# Patient Record
Sex: Female | Born: 1995 | Race: Black or African American | Hispanic: No | Marital: Single | Smoking: Never smoker
Health system: Southern US, Community
[De-identification: ages and names within clinical notes are randomized; demographics above are authoritative.]

## PROBLEM LIST (undated history)

## (undated) ENCOUNTER — Inpatient Hospital Stay (HOSPITAL_COMMUNITY): Payer: Self-pay

## (undated) DIAGNOSIS — O139 Gestational [pregnancy-induced] hypertension without significant proteinuria, unspecified trimester: Secondary | ICD-10-CM

## (undated) DIAGNOSIS — O24419 Gestational diabetes mellitus in pregnancy, unspecified control: Secondary | ICD-10-CM

## (undated) DIAGNOSIS — B977 Papillomavirus as the cause of diseases classified elsewhere: Secondary | ICD-10-CM

## (undated) DIAGNOSIS — F419 Anxiety disorder, unspecified: Secondary | ICD-10-CM

## (undated) DIAGNOSIS — N390 Urinary tract infection, site not specified: Secondary | ICD-10-CM

## (undated) DIAGNOSIS — Z789 Other specified health status: Secondary | ICD-10-CM

## (undated) HISTORY — PX: TOOTH EXTRACTION: SUR596

## (undated) HISTORY — DX: Gestational diabetes mellitus in pregnancy, unspecified control: O24.419

## (undated) HISTORY — PX: OTHER SURGICAL HISTORY: SHX169

## (undated) HISTORY — DX: Papillomavirus as the cause of diseases classified elsewhere: B97.7

---

## 2021-07-20 ENCOUNTER — Other Ambulatory Visit: Payer: Self-pay

## 2021-07-20 ENCOUNTER — Emergency Department (HOSPITAL_COMMUNITY)
Admission: EM | Admit: 2021-07-20 | Discharge: 2021-07-20 | Disposition: A | Discharge status: Home / Self Care | Payer: Self-pay | Attending: Emergency Medicine | Admitting: Emergency Medicine

## 2021-07-20 DIAGNOSIS — T7840XA Allergy, unspecified, initial encounter: Secondary | ICD-10-CM | POA: Insufficient documentation

## 2021-07-20 MED ORDER — PREDNISONE 20 MG PO TABS
60.0000 mg | ORAL_TABLET | Freq: Once | ORAL | Status: AC
  Administered 2021-07-20: 04:00:00 60 mg via ORAL
  Filled 2021-07-20: qty 3

## 2021-07-20 MED ORDER — HYDROXYZINE HCL 25 MG PO TABS: 25.0000 mg | ORAL_TABLET | Freq: Four times a day (QID) | ORAL | 0 refills | Status: DC

## 2021-07-20 MED ORDER — HYDROCORTISONE 1 % EX CREA: TOPICAL_CREAM | CUTANEOUS | 0 refills | Status: DC

## 2021-07-20 MED ORDER — PREDNISONE 50 MG PO TABS: 50.0000 mg | ORAL_TABLET | Freq: Every day | ORAL | 0 refills | Status: AC

## 2021-07-20 NOTE — ED Provider Notes (Signed)
[facility] EMERGENCY DEPARTMENT Provider Note   CSN: [id number] Arrival date & time: 07/20/21  8413     History Rash  Alexis Erickson is a 25 y.o. female with history significant for asthma who presents for evaluation of rash.  Noticed a few hours ago she was itching on her back and posterior aspect of her arms.  Looked and developed "bumps."  Did recently change detergents.  No sensation of throat closing, abdominal pain, emesis, paresthesias or weakness.  Took Benadryl PTA which is help with itching.  No prior history of anaphylaxis or allergic reaction.  No new food intakes, perfumes denies additional rating or relieving factors.  No insect/tick bites.  Does not take medications daily at baseline.  History obtained from patient and past medical records.  No interpreter used.  HPI     No past medical history on file.  There are no problems to display for this patient.  History reviewed  OB History   No obstetric history on file.     No family history on file.     Home Medications Prior to Admission medications   Medication Sig Start Date End Date Taking? Authorizing Provider  hydrocortisone cream 1 % Apply to affected area 2 times daily 07/20/21  Yes Tramaine Sauls A, PA-C  hydrOXYzine (ATARAX) 25 MG tablet Take 1 tablet (25 mg total) by mouth every 6 (six) hours. 07/20/21  Yes Ethelmae Ringel A, PA-C  predniSONE (DELTASONE) 50 MG tablet Take 1 tablet (50 mg total) by mouth daily for 5 days. 07/20/21 07/25/21 Yes Ronnisha Felber A, PA-C    Allergies    Patient has no known allergies.  Review of Systems   Review of Systems  Constitutional: Negative.   HENT: Negative.    Respiratory: Negative.    Cardiovascular: Negative.   Gastrointestinal: Negative.   Genitourinary: Negative.   Musculoskeletal: Negative.   Skin: Negative.   Neurological: Negative.   All other systems reviewed and are negative.  Physical Exam Updated Vital  Signs BP 120/80 (BP Location: Left Arm)    Pulse (!) 54    Temp 97.9 F (36.6 C) (Oral)    Resp 20    Ht 5' (1.524 m)    Wt 52.6 kg    SpO2 99%    BMI 22.65 kg/m   Physical Exam Vitals and nursing note reviewed.  Constitutional:      General: She is not in acute distress.    Appearance: She is well-developed. She is not ill-appearing, toxic-appearing or diaphoretic.  HENT:     Head: Normocephalic and atraumatic.     Nose: Nose normal.     Mouth/Throat:     Mouth: Mucous membranes are moist.     Comments: Posterior oropharynx clear.  Sublingual area soft.  No angioedema. Eyes:     Pupils: Pupils are equal, round, and reactive to light.  Neck:     Comments: Full range of motion Cardiovascular:     Rate and Rhythm: Normal rate.     Pulses: Normal pulses.     Heart sounds: Normal heart sounds.  Pulmonary:     Effort: Pulmonary effort is normal. No respiratory distress.     Breath sounds: Normal breath sounds.     Comments: Clear bilaterally, speaks in full sentences without difficulty Abdominal:     General: Bowel sounds are normal. There is no distension.     Palpations: Abdomen is soft.  Musculoskeletal:  General: Normal range of motion.     Cervical back: Normal range of motion and neck supple.     Comments: Full range of motion, no bony tenderness  Skin:    General: Skin is warm and dry.     Capillary Refill: Capillary refill takes less than 2 seconds.     Findings: Rash present.     Comments: Urticaria back as well as posterior arms.  No vesicles, bulla, desquamated skin, fluctuance, induration, target lesions.  Neurological:     General: No focal deficit present.     Mental Status: She is alert and oriented to person, place, and time.  Psychiatric:        Mood and Affect: Mood normal.    ED Results / Procedures / Treatments   Labs (all labs ordered are listed, but only abnormal results are displayed) Labs Reviewed - No data to  display  EKG None  Radiology No results found.  Procedures Procedures   Medications Ordered in ED Medications  predniSONE (DELTASONE) tablet 60 mg (has no administration in time range)    ED Course  I have reviewed the triage vital signs and the nursing notes.  Pertinent labs & imaging results that were available during my care of the patient were reviewed by me and considered in my medical decision making (see chart for details).  Rash consistent with allergic reaction. Patient denies any difficulty breathing or swallowing.  Pt has a patent airway without stridor and is handling secretions without difficulty; no angioedema. No blisters, no pustules, no warmth, no draining sinus tracts, no superficial abscesses, no bullous impetigo, no vesicles, no desquamation, no target lesions with dusky purpura or a central bulla. Not tender to touch. No concern for superimposed infection. No concern for SJS, TEN, TSS, tick borne illness, syphilis or other life-threatening condition. Will discharge home with short course of steroids and recommend hydroxyzine as needed for pruritis.  No anaphylaxis on exam.  The patient has been appropriately medically screened and/or stabilized in the ED. I have low suspicion for any other emergent medical condition which would require further screening, evaluation or treatment in the ED or require inpatient management.  Patient is hemodynamically stable and in no acute distress.  Patient able to ambulate in department prior to ED.  Evaluation does not show acute pathology that would require ongoing or additional emergent interventions while in the emergency department or further inpatient treatment.  I have discussed the diagnosis with the patient and answered all questions.  Pain is been managed while in the emergency department and patient has no further complaints prior to discharge.  Patient is comfortable with plan discussed in room and is stable for discharge at  this time.  I have discussed strict return precautions for returning to the emergency department.  Patient was encouraged to follow-up with PCP/specialist refer to at discharge.     MDM Rules/Calculators/A&P                            Final Clinical Impression(s) / ED Diagnoses Final diagnoses:  Allergic reaction, initial encounter    Rx / DC Orders ED Discharge Orders          Ordered    predniSONE (DELTASONE) 50 MG tablet  Daily        07/20/21 0405    hydrocortisone cream 1 %        07/20/21 0405    hydrOXYzine (ATARAX) 25 MG tablet  Every 6 hours        07/20/21 0405             Navah Grondin A, PA-C 07/20/21 0405    Geoffery Lyons, MD 07/20/21 [phone]

## 2021-07-20 NOTE — Discharge Instructions (Addendum)
Use the medications as prescribed.  Do not place hydrocortisone cream on your face.  Take additional Benadryl taking hydroxyzine.  Return for new worsening symptoms such as shortness of breath, vomiting, worsening rash

## 2021-07-20 NOTE — ED Notes (Signed)
Patient given discharge instructions. Questions were answered. Patient verbalized understanding of discharge instructions and care at home.  

## 2021-07-20 NOTE — ED Triage Notes (Signed)
Patient arrives with complaints of a sudden onset of itching and rash x4 hours. Patient states that it started suddenly and she took benadryl for the symptoms.   Patient does have mild redness to skin. Patient does endorse that she started using a new detergent and soap recently.

## 2022-01-06 ENCOUNTER — Encounter (HOSPITAL_COMMUNITY): Payer: Self-pay

## 2022-01-06 ENCOUNTER — Inpatient Hospital Stay (HOSPITAL_COMMUNITY)
Admission: AD | Admit: 2022-01-06 | Discharge: 2022-01-06 | Disposition: A | Discharge status: Home / Self Care | Payer: Medicaid Other | Attending: Obstetrics & Gynecology | Admitting: Obstetrics & Gynecology

## 2022-01-06 ENCOUNTER — Other Ambulatory Visit: Payer: Self-pay

## 2022-01-06 ENCOUNTER — Inpatient Hospital Stay (HOSPITAL_COMMUNITY): Payer: Medicaid Other

## 2022-01-06 DIAGNOSIS — R42 Dizziness and giddiness: Secondary | ICD-10-CM | POA: Diagnosis not present

## 2022-01-06 DIAGNOSIS — R1030 Lower abdominal pain, unspecified: Secondary | ICD-10-CM | POA: Insufficient documentation

## 2022-01-06 DIAGNOSIS — Z6711 Type A blood, Rh negative: Secondary | ICD-10-CM | POA: Insufficient documentation

## 2022-01-06 DIAGNOSIS — N898 Other specified noninflammatory disorders of vagina: Secondary | ICD-10-CM | POA: Insufficient documentation

## 2022-01-06 DIAGNOSIS — O3680X0 Pregnancy with inconclusive fetal viability, not applicable or unspecified: Secondary | ICD-10-CM | POA: Insufficient documentation

## 2022-01-06 DIAGNOSIS — Z3A01 Less than 8 weeks gestation of pregnancy: Secondary | ICD-10-CM | POA: Diagnosis not present

## 2022-01-06 DIAGNOSIS — O26891 Other specified pregnancy related conditions, first trimester: Secondary | ICD-10-CM | POA: Diagnosis not present

## 2022-01-06 LAB — CBC
HCT: 40.8 % (ref 36.0–46.0)
Hemoglobin: 13.7 g/dL (ref 12.0–15.0)
MCH: 28 pg (ref 26.0–34.0)
MCHC: 33.6 g/dL (ref 30.0–36.0)
MCV: 83.4 fL (ref 80.0–100.0)
Platelets: 182 10*3/uL (ref 150–400)
RBC: 4.89 MIL/uL (ref 3.87–5.11)
RDW: 13.2 % (ref 11.5–15.5)
WBC: 10.1 10*3/uL (ref 4.0–10.5)
nRBC: 0 % (ref 0.0–0.2)

## 2022-01-06 LAB — URINALYSIS, ROUTINE W REFLEX MICROSCOPIC
Bilirubin Urine: NEGATIVE
Glucose, UA: NEGATIVE mg/dL
Hgb urine dipstick: NEGATIVE
Ketones, ur: 20 mg/dL — AB
Nitrite: NEGATIVE
Protein, ur: NEGATIVE mg/dL
Specific Gravity, Urine: 1.01 (ref 1.005–1.030)
pH: 7 (ref 5.0–8.0)

## 2022-01-06 LAB — POCT PREGNANCY, URINE: Preg Test, Ur: POSITIVE — AB

## 2022-01-06 LAB — WET PREP, GENITAL
Clue Cells Wet Prep HPF POC: NONE SEEN
Sperm: NONE SEEN
Trich, Wet Prep: NONE SEEN
WBC, Wet Prep HPF POC: >=10 — AB (ref <10)
Yeast Wet Prep HPF POC: NONE SEEN

## 2022-01-06 LAB — ABO/RH
ABO/RH(D): A NEG
Antibody Screen: NEGATIVE

## 2022-01-06 LAB — HCG, QUANTITATIVE, PREGNANCY: hCG, Beta Chain, Quant, S: 136931 m[IU]/mL — ABNORMAL HIGH (ref <5)

## 2022-01-06 NOTE — MAU Note (Addendum)
...  Alexis Erickson is a 26 y.o. at [redacted]w[redacted]d here in MAU reporting: Intermittent left lower abdominal "discomfort" that feels as if she needs to have a bowel movement but reports she had a bowel movement this morning that was green and loose. She is also endorsing occasional dizziness with standing and different activities. She reports she drinks around four normal sized water bottles each day. Denies vaginal bleeding and vaginal itching. Endorses light yellow vaginal discharge. Last IC this past Wednesday.  LMP: 11/11/2021 Onset of complaint: 01/05/2022 Pain score: 4/10 left lower abdomen  Lab orders placed from triage: POCT Pregnancy, UA

## 2022-01-06 NOTE — MAU Provider Note (Signed)
History     CSN: [id number]  Arrival date and time: 01/06/22 1034   Event Date/Time   First Provider Initiated Contact with Patient 01/06/22 1156      Chief Complaint  Patient presents with   Stomach pn dizziness    Stomach pn dizziness   Abdominal Pain   HPI [name] is a 26 y.o. G1P0 at [redacted]w[redacted]d who presents to MAU today with complaint of lower abdominal pain. She denies vaginal bleeding, but has noted some light yellow discharge recently. Last intercourse was last week. She rates her pain at 4/10 now. She denies N/V/D or UTI symptoms. LMP4/04/2022.    OB History     Gravida  1   Para      Term      Preterm      AB      Living         SAB      IAB      Ectopic      Multiple      Live Births              History reviewed. No pertinent past medical history.  Past Surgical History:  Procedure Laterality Date   OTHER SURGICAL HISTORY     teeth removed    Family History  Problem Relation Age of Onset   Diabetes Mother    Cancer Maternal Grandmother        breast    Social History   Tobacco Use   Smoking status: Never   Smokeless tobacco: Never  Substance Use Topics   Alcohol use: Never   Drug use: Never    Allergies: No Known Allergies  No medications prior to admission.    Review of Systems  Constitutional:  Negative for fatigue.  Gastrointestinal:  Negative for abdominal pain, nausea and vomiting.  Genitourinary:  Positive for vaginal discharge. Negative for dysuria, flank pain and vaginal bleeding.  Physical Exam   Blood pressure 115/70, pulse 71, temperature 98.4 F (36.9 C), temperature source Oral, resp. rate 17, height 5\' 1"  (1.549 m), weight 51.8 kg, last menstrual period 11/13/2021, SpO2 100 %.  Physical Exam Vitals and nursing note reviewed.  Constitutional:      General: She is not in acute distress.    Appearance: She is well-developed and normal weight.  HENT:     Head: Normocephalic and  atraumatic.  Abdominal:     General: Abdomen is flat.     Tenderness: There is no abdominal tenderness.  Skin:    General: Skin is warm and dry.     Findings: No erythema.  Neurological:     Mental Status: She is alert.  Psychiatric:        Mood and Affect: Mood normal.   Results for orders placed or performed during the hospital encounter of 01/06/22 (from the past 24 hour(s))  Pregnancy, urine POC     Status: Abnormal   Collection Time: 01/06/22 11:03 AM  Result Value Ref Range   Preg Test, Ur POSITIVE (A) NEGATIVE  Urinalysis, Routine w reflex microscopic Urine, Clean Catch     Status: Abnormal   Collection Time: 01/06/22 11:12 AM  Result Value Ref Range   Color, Urine YELLOW YELLOW   APPearance HAZY (A) CLEAR   Specific Gravity, Urine 1.010 1.005 - 1.030   pH 7.0 5.0 - 8.0   Glucose, UA NEGATIVE NEGATIVE mg/dL   Hgb urine dipstick NEGATIVE NEGATIVE   Bilirubin Urine NEGATIVE  NEGATIVE   Ketones, ur 20 (A) NEGATIVE mg/dL   Protein, ur NEGATIVE NEGATIVE mg/dL   Nitrite NEGATIVE NEGATIVE   Leukocytes,Ua LARGE (A) NEGATIVE   RBC / HPF 0-5 0 - 5 RBC/hpf   WBC, UA 6-10 0 - 5 WBC/hpf   Bacteria, UA MANY (A) NONE SEEN   Squamous Epithelial / LPF 6-10 0 - 5   Mucus PRESENT   CBC     Status: None   Collection Time: 01/06/22 11:54 AM  Result Value Ref Range   WBC 10.1 4.0 - 10.5 K/uL   RBC 4.89 3.87 - 5.11 MIL/uL   Hemoglobin 13.7 12.0 - 15.0 g/dL   HCT 45.440.8 09.836.0 - 11.946.0 %   MCV 83.4 80.0 - 100.0 fL   MCH 28.0 26.0 - 34.0 pg   MCHC 33.6 30.0 - 36.0 g/dL   RDW 14.713.2 82.911.5 - 56.215.5 %   Platelets 182 150 - 400 K/uL   nRBC 0.0 0.0 - 0.2 %  hCG, quantitative, pregnancy     Status: Abnormal   Collection Time: 01/06/22 11:54 AM  Result Value Ref Range   hCG, Beta Chain, Quant, S 136,931 (H) <5 mIU/mL  ABO/Rh     Status: None   Collection Time: 01/06/22 11:54 AM  Result Value Ref Range   ABO/RH(D) A NEG    Antibody Screen      NEG Performed at [facility], [address] [place], [place] [address]   Wet prep, genital     Status: Abnormal   Collection Time: 01/06/22 12:06 PM   Specimen: Vaginal  Result Value Ref Range   Yeast Wet Prep HPF POC NONE SEEN NONE SEEN   Trich, Wet Prep NONE SEEN NONE SEEN   Clue Cells Wet Prep HPF POC NONE SEEN NONE SEEN   WBC, Wet Prep HPF POC >=10 (A) <10   Sperm NONE SEEN    US OB Transvaginal  Result Date: 01/06/2022 CLINICAL DATA:  Pregnant of unknown anatomic location, abdominal pain, LMP 11/13/2021, pending quantitative beta HCG EXAM: TRANSVAGINAL OB ULTRASOUND TECHNIQUE: Transvaginal ultrasound was performed for complete evaluation of the gestation as well as the maternal uterus, adnexal regions, and pelvic cul-de-sac. COMPARISON:  None FINDINGS: Intrauterine gestational sac: Present, single Yolk sac:  Present Embryo:  Present Cardiac Activity: Present Heart Rate: Not identified bpm MSD:  27.7 mm, prominent size for fetal pole and yolk sac CRL:   2.5 mm   5 w 5 d                  US EDC: 09/03/2022 Subchorionic hemorrhage:  None visualized. Maternal uterus/adnexae: Uterus anteverted, otherwise unremarkable. RIGHT ovary measures 4.3 x 2.7 x 4.5 cm and contains a complicated cyst question hemorrhagic corpus luteal cyst 3.1 cm greatest size. Is seen LEFT ovary normal size and morphology, 3.6 x 2.7 x 2.5 cm. No free pelvic fluid or adnexal masses. IMPRESSION: Intrauterine gestational sac, containing a fetal pole and yolk sac. Gestational sac appears large for the size of the fetal pole and yolk sac. No fetal cardiac activity identified. Findings are suspicious but not yet definitive for failed pregnancy. Recommend follow-up US in 10-14 days for definitive diagnosis. This recommendation follows SRU consensus guidelines: Diagnostic Criteria for Nonviable Pregnancy Early in the First Trimester. Malva Limes Engl J Med 2013; 578:4696-29; 369:1443-51. Electronically Signed   By: Ulyses SouthwardMark  Boles M.D.   On: 01/06/2022 13:02    MAU Course   Procedures None  MDM +UPT UA, wet prep,  GC/chlamydia, CBC, ABO/Rh, quant hCG and [place] today to rule out ectopic pregnancy Urine culture ordered   Assessment and Plan  A:  SIUP at [redacted]w[redacted]d Pregnancy of uncertain viability  Abdominal pain in pregnancy, first trimester   P: Discharge home Urine culture and GC/CT pending  First trimester precautions discussed Patient advised to follow-up with [facility] on 01/17/22 for follow-up US Patient may return to MAU as needed or if her condition were to change or worsen  Vonzella Nipple, PA-C 01/06/2022, 2:05 PM

## 2022-01-09 ENCOUNTER — Other Ambulatory Visit: Payer: Self-pay | Admitting: Advanced Practice Midwife

## 2022-01-09 DIAGNOSIS — A749 Chlamydial infection, unspecified: Secondary | ICD-10-CM

## 2022-01-09 HISTORY — DX: Chlamydial infection, unspecified: A74.9

## 2022-01-09 LAB — CULTURE, OB URINE: Culture: >=100000 — AB

## 2022-01-09 LAB — GC/CHLAMYDIA PROBE AMP (~~LOC~~) NOT AT ARMC
Chlamydia: POSITIVE — AB
Comment: NEGATIVE
Comment: NORMAL
Neisseria Gonorrhea: NEGATIVE

## 2022-01-09 MED ORDER — AZITHROMYCIN 500 MG PO TABS: 1000.0000 mg | ORAL_TABLET | Freq: Once | ORAL | 0 refills | Status: AC

## 2022-01-10 ENCOUNTER — Encounter: Payer: Self-pay | Admitting: Advanced Practice Midwife

## 2022-01-10 ENCOUNTER — Other Ambulatory Visit: Payer: Self-pay | Admitting: Advanced Practice Midwife

## 2022-01-10 MED ORDER — NITROFURANTOIN MONOHYD MACRO 100 MG PO CAPS: 100.0000 mg | ORAL_CAPSULE | Freq: Two times a day (BID) | ORAL | 0 refills | Status: AC

## 2022-01-17 ENCOUNTER — Encounter (HOSPITAL_COMMUNITY): Payer: Self-pay | Admitting: Obstetrics & Gynecology

## 2022-01-17 ENCOUNTER — Ambulatory Visit (INDEPENDENT_AMBULATORY_CARE_PROVIDER_SITE_OTHER): Payer: Medicaid Other

## 2022-01-17 ENCOUNTER — Other Ambulatory Visit: Payer: Self-pay

## 2022-01-17 ENCOUNTER — Telehealth: Payer: Self-pay

## 2022-01-17 DIAGNOSIS — O3680X0 Pregnancy with inconclusive fetal viability, not applicable or unspecified: Secondary | ICD-10-CM

## 2022-01-17 DIAGNOSIS — Z3A01 Less than 8 weeks gestation of pregnancy: Secondary | ICD-10-CM

## 2022-01-17 DIAGNOSIS — O021 Missed abortion: Secondary | ICD-10-CM | POA: Diagnosis not present

## 2022-01-17 NOTE — Progress Notes (Signed)
   GYNECOLOGY OFFICE VISIT NOTE  History:   Kimbery Dineen Trojanowski is a 26 y.o. G1P0 here today for ultrasound results.  Ultrasound shows irregularly shaped gestational sac, no growth in fetal pole from previous ultrasound and no FHR.  Abdominal pain: No   Vaginal bleeding: No    OB History  Gravida Para Term Preterm AB Living  1            SAB IAB Ectopic Multiple Live Births               # Outcome Date GA Lbr Len/2nd Weight Sex Delivery Anes PTL Lv  1 Current              Health Maintenance Due  Topic Date Due   HPV VACCINES (1 - 2-dose series) Never done   HIV Screening  Never done   Hepatitis C Screening  Never done   TETANUS/TDAP  Never done   PAP-Cervical Cytology Screening  Never done   PAP SMEAR-Modifier  Never done   COVID-19 Vaccine (3 - Pfizer series) 05/30/2020    No past medical history on file.  Past Surgical History:  Procedure Laterality Date   OTHER SURGICAL HISTORY     teeth removed    The following portions of the patient's history were reviewed and updated as appropriate: allergies, current medications, past family history, past medical history, past social history, past surgical history and problem list.   Review of Systems:  Pertinent items noted in HPI and remainder of comprehensive ROS otherwise negative.  Physical Exam:  LMP 11/13/2021 (Exact Date)  -unable to obtain VS due to patient distress regarding results  CONSTITUTIONAL: Well-developed, well-nourished female in no acute distress.  HEENT:  Normocephalic, atraumatic. External right and left ear normal. No scleral icterus.  NECK: Normal range of motion, supple, no masses noted on observation SKIN: No rash noted. Not diaphoretic. No erythema. No pallor. MUSCULOSKELETAL: Normal range of motion. No edema noted. NEUROLOGIC: Alert and oriented to person, place, and time. Normal muscle tone coordination.  PSYCHIATRIC: Normal mood and affect. Normal behavior. Normal judgment and thought  content. RESPIRATORY: Effort normal, no problems with respiration noted   Assessment and Plan:  Nonviable Pregnancy - O02.89  CNM informed patient of results of ultrasound showing missed AB. Condolences provided and space given for patient to process emotions. CNM discussed options for management including expectant management, cytotec and surgical management. Risks and benefits of each reviewed at length. Patient desires surgical management and verbalized understanding of risks and benefits of this method. Message sent to surgery scheduler to schedule outpatient surgery.   Please refer to After Visit Summary for other counseling recommendations.   Total face-to-face time with patient: 30 minutes.  Over 50% of encounter was spent on counseling and coordination of care.   Wende Mott, [place] for [employer], [place]

## 2022-01-17 NOTE — Telephone Encounter (Signed)
Called patient, surgery date, time, location and preop instructions given. Patient expressed understanding. 

## 2022-01-17 NOTE — Progress Notes (Signed)
PCP - none Cardiologist - n/a  Chest x-ray - n/a EKG - n/a Stress Test - n/a ECHO - n/a Cardiac Cath - n/a  ICD Pacemaker/Loop - n/a  Sleep Study -  n/a CPAP - none  STOP now taking any Aspirin (unless otherwise instructed by your surgeon), Aleve, Naproxen, Ibuprofen, Motrin, Advil, Goody's, BC's, all herbal medications, fish oil, and all vitamins.   Coronavirus Screening Do you have any of the following symptoms:  Cough yes/no: No Fever (>100.4F)  yes/no: No Runny nose yes/no: No Sore throat yes/no: No Difficulty breathing/shortness of breath  yes/no: No  Have you traveled in the last 14 days and where? yes/no: No  Patient verbalized understanding of instructions that were given via phone. 

## 2022-01-18 ENCOUNTER — Ambulatory Visit: Payer: Medicaid Other | Admitting: Obstetrics and Gynecology

## 2022-01-19 ENCOUNTER — Ambulatory Visit (HOSPITAL_BASED_OUTPATIENT_CLINIC_OR_DEPARTMENT_OTHER): Payer: Medicaid Other | Admitting: Certified Registered"

## 2022-01-19 ENCOUNTER — Encounter (HOSPITAL_COMMUNITY): Payer: Self-pay | Admitting: Obstetrics & Gynecology

## 2022-01-19 ENCOUNTER — Encounter (HOSPITAL_COMMUNITY)
Admission: RE | Disposition: A | Discharge status: Home / Self Care | Payer: Self-pay | Source: Home / Self Care | Attending: Obstetrics & Gynecology

## 2022-01-19 ENCOUNTER — Ambulatory Visit (HOSPITAL_COMMUNITY)
Admission: RE | Admit: 2022-01-19 | Discharge: 2022-01-19 | Disposition: A | Discharge status: Home / Self Care | Payer: Medicaid Other | Attending: Obstetrics & Gynecology | Admitting: Obstetrics & Gynecology

## 2022-01-19 ENCOUNTER — Encounter: Payer: Self-pay | Admitting: Obstetrics & Gynecology

## 2022-01-19 ENCOUNTER — Other Ambulatory Visit: Payer: Self-pay

## 2022-01-19 ENCOUNTER — Ambulatory Visit (HOSPITAL_COMMUNITY): Payer: Medicaid Other | Admitting: Certified Registered"

## 2022-01-19 DIAGNOSIS — O021 Missed abortion: Secondary | ICD-10-CM

## 2022-01-19 HISTORY — PX: DILATION AND EVACUATION: SHX1459

## 2022-01-19 LAB — CBC
HCT: 41 % (ref 36.0–46.0)
Hemoglobin: 14.3 g/dL (ref 12.0–15.0)
MCH: 29.1 pg (ref 26.0–34.0)
MCHC: 34.9 g/dL (ref 30.0–36.0)
MCV: 83.3 fL (ref 80.0–100.0)
Platelets: 190 10*3/uL (ref 150–400)
RBC: 4.92 MIL/uL (ref 3.87–5.11)
RDW: 13.5 % (ref 11.5–15.5)
WBC: 8.7 10*3/uL (ref 4.0–10.5)
nRBC: 0 % (ref 0.0–0.2)

## 2022-01-19 LAB — TYPE AND SCREEN
ABO/RH(D): A NEG
Antibody Screen: NEGATIVE

## 2022-01-19 SURGERY — DILATION AND EVACUATION, UTERUS
Anesthesia: General | Site: Uterus

## 2022-01-19 MED ORDER — DOCUSATE SODIUM 100 MG PO CAPS: 100.0000 mg | ORAL_CAPSULE | Freq: Two times a day (BID) | ORAL | 2 refills | Status: DC | PRN

## 2022-01-19 MED ORDER — MIDAZOLAM HCL 2 MG/2ML IJ SOLN
INTRAMUSCULAR | Status: AC
Start: 2022-01-19 — End: ?
  Filled 2022-01-19: qty 2

## 2022-01-19 MED ORDER — ONDANSETRON HCL 4 MG/2ML IJ SOLN
INTRAMUSCULAR | Status: AC
  Filled 2022-01-19: qty 2

## 2022-01-19 MED ORDER — 0.9 % SODIUM CHLORIDE (POUR BTL) OPTIME
TOPICAL | Status: DC | PRN
  Administered 2022-01-19: 1000 mL

## 2022-01-19 MED ORDER — DEXAMETHASONE SODIUM PHOSPHATE 10 MG/ML IJ SOLN
INTRAMUSCULAR | Status: AC
  Filled 2022-01-19: qty 1

## 2022-01-19 MED ORDER — BUPIVACAINE HCL 0.5 % IJ SOLN
INTRAMUSCULAR | Status: AC
Start: 2022-01-19 — End: ?
  Filled 2022-01-19: qty 1

## 2022-01-19 MED ORDER — LIDOCAINE 2% (20 MG/ML) 5 ML SYRINGE
INTRAMUSCULAR | Status: DC | PRN
  Administered 2022-01-19: 60 mg via INTRAVENOUS

## 2022-01-19 MED ORDER — ACETAMINOPHEN 500 MG PO TABS
1000.0000 mg | ORAL_TABLET | ORAL | Status: AC
  Administered 2022-01-19: 1000 mg via ORAL
  Filled 2022-01-19: qty 2

## 2022-01-19 MED ORDER — HYDROMORPHONE HCL 1 MG/ML IJ SOLN: 0.2500 mg | INTRAMUSCULAR | Status: DC | PRN

## 2022-01-19 MED ORDER — LACTATED RINGERS IV SOLN: INTRAVENOUS | Status: DC

## 2022-01-19 MED ORDER — MIDAZOLAM HCL 2 MG/2ML IJ SOLN
INTRAMUSCULAR | Status: DC | PRN
  Administered 2022-01-19: 2 mg via INTRAVENOUS

## 2022-01-19 MED ORDER — FENTANYL CITRATE (PF) 250 MCG/5ML IJ SOLN
INTRAMUSCULAR | Status: DC | PRN
  Administered 2022-01-19: 50 ug via INTRAVENOUS

## 2022-01-19 MED ORDER — ONDANSETRON HCL 4 MG/2ML IJ SOLN
INTRAMUSCULAR | Status: DC | PRN
  Administered 2022-01-19: 4 mg via INTRAVENOUS

## 2022-01-19 MED ORDER — RHO D IMMUNE GLOBULIN 1500 UNIT/2ML IJ SOSY
300.0000 ug | PREFILLED_SYRINGE | Freq: Once | INTRAMUSCULAR | Status: AC
  Administered 2022-01-19: 300 ug via INTRAMUSCULAR
  Filled 2022-01-19: qty 2

## 2022-01-19 MED ORDER — FENTANYL CITRATE (PF) 250 MCG/5ML IJ SOLN
INTRAMUSCULAR | Status: AC
  Filled 2022-01-19: qty 5

## 2022-01-19 MED ORDER — DOXYCYCLINE HYCLATE 100 MG IV SOLR
200.0000 mg | INTRAVENOUS | Status: AC
  Administered 2022-01-19: 200 mg via INTRAVENOUS
  Filled 2022-01-19: qty 200

## 2022-01-19 MED ORDER — CHLORHEXIDINE GLUCONATE 0.12 % MT SOLN
OROMUCOSAL | Status: AC
  Administered 2022-01-19: 15 mL
  Filled 2022-01-19: qty 15

## 2022-01-19 MED ORDER — IBUPROFEN 600 MG PO TABS: 600.0000 mg | ORAL_TABLET | Freq: Four times a day (QID) | ORAL | 2 refills | Status: DC | PRN

## 2022-01-19 MED ORDER — GABAPENTIN 300 MG PO CAPS
300.0000 mg | ORAL_CAPSULE | ORAL | Status: AC
  Administered 2022-01-19: 300 mg via ORAL
  Filled 2022-01-19: qty 1

## 2022-01-19 MED ORDER — DEXAMETHASONE SODIUM PHOSPHATE 10 MG/ML IJ SOLN
INTRAMUSCULAR | Status: DC | PRN
  Administered 2022-01-19: 5 mg via INTRAVENOUS

## 2022-01-19 MED ORDER — PROPOFOL 10 MG/ML IV BOLUS
INTRAVENOUS | Status: AC
  Filled 2022-01-19: qty 20

## 2022-01-19 MED ORDER — PROPOFOL 10 MG/ML IV BOLUS
INTRAVENOUS | Status: DC | PRN
  Administered 2022-01-19: 130 mg via INTRAVENOUS

## 2022-01-19 MED ORDER — OXYCODONE HCL 5 MG PO TABS: 5.0000 mg | ORAL_TABLET | ORAL | 0 refills | Status: DC | PRN

## 2022-01-19 MED ORDER — LIDOCAINE 2% (20 MG/ML) 5 ML SYRINGE
INTRAMUSCULAR | Status: AC
  Filled 2022-01-19: qty 10

## 2022-01-19 MED ORDER — KETOROLAC TROMETHAMINE 15 MG/ML IJ SOLN
15.0000 mg | INTRAMUSCULAR | Status: AC
  Administered 2022-01-19: 15 mg via INTRAVENOUS
  Filled 2022-01-19: qty 1

## 2022-01-19 MED ORDER — BUPIVACAINE HCL 0.5 % IJ SOLN
INTRAMUSCULAR | Status: DC | PRN
  Administered 2022-01-19: 30 mL

## 2022-01-19 SURGICAL SUPPLY — 22 items
CATH ROBINSON RED A/P 16FR (CATHETERS) ×2 IMPLANT
FILTER UTR ASPR ASSEMBLY (MISCELLANEOUS) ×2 IMPLANT
GLOVE BIOGEL PI IND STRL 7.0 (GLOVE) ×1 IMPLANT
GLOVE BIOGEL PI INDICATOR 7.0 (GLOVE) ×1
GLOVE ECLIPSE 7.0 STRL STRAW (GLOVE) ×2 IMPLANT
GOWN STRL REUS W/ TWL LRG LVL3 (GOWN DISPOSABLE) ×2 IMPLANT
GOWN STRL REUS W/TWL LRG LVL3 (GOWN DISPOSABLE) ×2
HOSE CONNECTING 18IN BERKELEY (TUBING) ×2 IMPLANT
KIT BERKELEY 1ST TRI 3/8 NO TR (MISCELLANEOUS) ×2 IMPLANT
KIT BERKELEY 1ST TRIMESTER 3/8 (MISCELLANEOUS) ×2 IMPLANT
NS IRRIG 1000ML POUR BTL (IV SOLUTION) ×2 IMPLANT
PACK VAGINAL MINOR WOMEN LF (CUSTOM PROCEDURE TRAY) ×2 IMPLANT
PAD OB MATERNITY 4.3X12.25 (PERSONAL CARE ITEMS) ×2 IMPLANT
SET BERKELEY SUCTION TUBING (SUCTIONS) ×2 IMPLANT
SPIKE FLUID TRANSFER (MISCELLANEOUS) ×2 IMPLANT
TOWEL GREEN STERILE FF (TOWEL DISPOSABLE) ×4 IMPLANT
UNDERPAD 30X36 HEAVY ABSORB (UNDERPADS AND DIAPERS) ×2 IMPLANT
VACURETTE 10 RIGID CVD (CANNULA) IMPLANT
VACURETTE 6 ASPIR F TIP BERK (CANNULA) IMPLANT
VACURETTE 7MM CVD STRL WRAP (CANNULA) ×1 IMPLANT
VACURETTE 8 RIGID CVD (CANNULA) IMPLANT
VACURETTE 9 RIGID CVD (CANNULA) IMPLANT

## 2022-01-19 NOTE — Op Note (Signed)
Alexis Erickson PROCEDURE DATE: 01/19/2022  PREOPERATIVE DIAGNOSIS: 5 week missed abortion POSTOPERATIVE DIAGNOSIS: The same PROCEDURE:     Dilation and Evacuation SURGEON:  Dr. Jaynie Collins  INDICATIONS: 26 y.o. G1P0 with MAB at [redacted] weeks gestation, needing surgical completion.  Risks of surgery were discussed with the patient including but not limited to: bleeding which may require transfusion; infection which may require antibiotics; injury to uterus or surrounding organs; need for additional procedures including laparotomy or laparoscopy; possibility of intrauterine scarring which may impair future fertility; and other postoperative/anesthesia complications. Written informed consent was obtained.    FINDINGS:  A 5 week size uterus, moderate amounts of products of conception, specimen sent to pathology.  ANESTHESIA: General-LMA, paracervical block. ESTIMATED BLOOD LOSS:  50 ml. SPECIMENS:  Products of conception sent to pathology*and some products of conception were sent for [facility] genetic analysis COMPLICATIONS:  None immediate.  PROCEDURE DETAILS:  The patient received intravenous Doxycycline while in the preoperative area.  She was then taken to the operating room where general anesthesia was administered and was found to be adequate.  After an adequate timeout was performed, she was placed in the dorsal lithotomy position and examined; then prepped and draped in the sterile manner.   Her bladder was catheterized for an unmeasured amount of clear, yellow urine. A vaginal speculum was then placed in the patient's vagina and a single tooth tenaculum was applied to the anterior lip of the cervix.  A paracervical block using 30 ml of 0.5% Marcaine was administered. The cervix was gently dilated to accommodate a 8 mm suction curette that was gently advanced to the uterine fundus. The suction device was then activated and curette slowly rotated to clear the uterus of products of conception.   Suction curettage was done until complete emptying of the uterus was confirmed. There was minimal bleeding noted and the tenaculum removed with good hemostasis noted.   All instruments were removed from the patient's vagina.  Sponge and instrument counts were correct times two  The patient tolerated the procedure well and was taken to the recovery area extubated, awake, and in stable condition.  The patient will be discharged to home as per PACU criteria.  Routine postoperative instructions given.  She was prescribed Oxycodone, Ibuprofen and Colace.  She will follow up in the office in  about 3 weeks for postoperative evaluation  Jaynie Collins, MD, FACOG Obstetrician & Gynecologist, [facility] for [employer], [facility]

## 2022-01-19 NOTE — H&P (Signed)
Preoperative History and Physical  Alexis Erickson is a 26 y.o. G1P0 here for surgical management of missed abortion in the first trimester, patient declined expectant management or misoprostol treatment.   No significant preoperative concerns.  Proposed surgery: Dilation and Evacuation  Past Medical History:  Diagnosis Date   Chlamydia infection 01/09/2022    Past Surgical History:  Procedure Laterality Date   TOOTH EXTRACTION     OB History  Gravida Para Term Preterm AB Living  1            SAB IAB Ectopic Multiple Live Births               # Outcome Date GA Lbr Len/2nd Weight Sex Delivery Anes PTL Lv  1 Current           Patient denies any other pertinent gynecologic issues.   No current facility-administered medications on file prior to encounter.   Current Outpatient Medications on File Prior to Encounter  Medication Sig Dispense Refill   hydrocortisone cream 1 % Apply to affected area 2 times daily (Patient not taking: Reported on 01/17/2022) 15 g 0   hydrOXYzine (ATARAX) 25 MG tablet Take 1 tablet (25 mg total) by mouth every 6 (six) hours. (Patient not taking: Reported on 01/17/2022) 12 tablet 0   No Known Allergies  Social History:   reports that she has never smoked. She has never used smokeless tobacco. She reports that she does not currently use alcohol. She reports that she does not currently use drugs after having used the following drugs: Marijuana.  Family History  Problem Relation Age of Onset   Diabetes Mother    Cancer Maternal Grandmother        breast    Review of Systems: Pertinent items noted in HPI and remainder of comprehensive ROS otherwise negative.  PHYSICAL EXAM: Blood pressure 119/84, pulse 88, temperature 98.4 F (36.9 C), temperature source Oral, resp. rate 20, height 5\' 1"  (1.549 m), weight 51.7 kg, last menstrual period 11/13/2021, SpO2 100 %. CONSTITUTIONAL: Well-developed, well-nourished female in no acute distress.   HENT:  Normocephalic, atraumatic, External right and left ear normal. Oropharynx is clear and moist EYES: Conjunctivae and EOM are normal. Pupils are equal, round, and reactive to light. No scleral icterus.  NECK: Normal range of motion, supple, no masses SKIN: Skin is warm and dry. No rash noted. Not diaphoretic. No erythema. No pallor. NEUROLOGIC: Alert and oriented to person, place, and time. Normal reflexes, muscle tone coordination. No cranial nerve deficit noted. PSYCHIATRIC: Normal mood and affect. Normal behavior. Normal judgment and thought content. CARDIOVASCULAR: Normal heart rate noted, regular rhythm RESPIRATORY: Effort and breath sounds normal, no problems with respiration noted ABDOMEN: Soft, nontender, nondistended. PELVIC: Deferred MUSCULOSKELETAL: Normal range of motion. No edema and no tenderness. 2+ distal pulses.  Labs:    Latest Ref Rng & Units 01/19/2022    7:47 AM 01/06/2022   11:54 AM  CBC  WBC 4.0 - 10.5 K/uL 8.7  10.1   Hemoglobin 12.0 - 15.0 g/dL 03/08/2022  92.3   Hematocrit 36.0 - 46.0 % 41.0  40.8   Platelets 150 - 400 K/uL 190  182    Imaging Studies: 30.0 OB Transvaginal  Result Date: 01/06/2022 CLINICAL DATA:  Pregnant of unknown anatomic location, abdominal pain, LMP 11/13/2021, pending quantitative beta HCG EXAM: TRANSVAGINAL OB ULTRASOUND TECHNIQUE: Transvaginal ultrasound was performed for complete evaluation of the gestation as well as the maternal uterus, adnexal regions, and  pelvic cul-de-sac. COMPARISON:  None FINDINGS: Intrauterine gestational sac: Present, single Yolk sac:  Present Embryo:  Present Cardiac Activity: Present Heart Rate: Not identified bpm MSD:  27.7 mm, prominent size for fetal pole and yolk sac CRL:   2.5 mm   5 w 5 d                  [place] EDC: 09/03/2022 Subchorionic hemorrhage:  None visualized. Maternal uterus/adnexae: Uterus anteverted, otherwise unremarkable. RIGHT ovary measures 4.3 x 2.7 x 4.5 cm and contains a complicated cyst  question hemorrhagic corpus luteal cyst 3.1 cm greatest size. Is seen LEFT ovary normal size and morphology, 3.6 x 2.7 x 2.5 cm. No free pelvic fluid or adnexal masses. IMPRESSION: Intrauterine gestational sac, containing a fetal pole and yolk sac. Gestational sac appears large for the size of the fetal pole and yolk sac. No fetal cardiac activity identified. Findings are suspicious but not yet definitive for failed pregnancy. Recommend follow-up US in 10-14 days for definitive diagnosis. This recommendation follows SRU consensus guidelines: Diagnostic Criteria for Nonviable Pregnancy Early in the First Trimester. Malva Limes Med 2013; 182:9937-16. Electronically Signed   By: Ulyses Southward M.D.   On: 01/06/2022 13:02    Assessment: Principal Problem:   Missed abortion   Plan: Patient will undergo surgical management with Dilation and Evacuation.  Desires ANORA evaluation of products of conception, discussed possibility of inadequate tissue specimen and nonspecific genetic results with this early stage of pregnancy. The risks of surgery were discussed in detail with the patient including but not limited to:  bleeding, infection, injury to surrounding organs, need for additional procedures, possibility of intrauterine scarring which may impair future fertility, risk of retained products which may require further management and other postoperative/anesthesia complications were explained to patient.  Likelihood of success of complete evacuation of the uterus was discussed with the patient.  Written informed consent was obtained.  Patient has been NPO since last night  she will remain NPO for procedure. Anesthesia and OR aware.  Preoperative prophylactic Doxycycline 200mg  IV  has been ordered and is on call to the OR.  To OR when ready.     , MD, FACOG Obstetrician & Gynecologist, [facility] for [facility]

## 2022-01-19 NOTE — Transfer of Care (Signed)
Immediate Anesthesia Transfer of Care Note  Patient: Alexis Erickson  Procedure(s) Performed: DILATATION AND EVACUATION  Patient Location: PACU  Anesthesia Type:General  Level of Consciousness: lethargic and responds to stimulation  Airway & Oxygen Therapy: Patient Spontanous Breathing and Patient connected to face mask oxygen  Post-op Assessment: Report given to RN  Post vital signs: Reviewed and stable  Last Vitals:  Vitals Value Taken Time  BP 107/71 01/19/22 1010  Temp    Pulse 73 01/19/22 1010  Resp 20 01/19/22 1010  SpO2 100 % 01/19/22 1010  Vitals shown include unvalidated device data.  Last Pain:  Vitals:   01/19/22 0719  TempSrc:   PainSc: 0-No pain      Patients Stated Pain Goal: 0 (01/19/22 0719)  Complications: No notable events documented.

## 2022-01-19 NOTE — Progress Notes (Signed)
PACU RN made aware of Rhophylac injection needed post-op per MD order in Epic.

## 2022-01-19 NOTE — Anesthesia Preprocedure Evaluation (Addendum)
Anesthesia Evaluation  Patient identified by MRN, date of birth, ID band Patient awake    Reviewed: Allergy & Precautions, H&P , NPO status , Patient's Chart, lab work & pertinent test results  Airway Mallampati: I  TM Distance: >3 FB Neck ROM: Full    Dental no notable dental hx. (+) Teeth Intact, Dental Advisory Given   Pulmonary neg pulmonary ROS,    Pulmonary exam normal breath sounds clear to auscultation       Cardiovascular negative cardio ROS   Rhythm:Regular Rate:Normal     Neuro/Psych negative neurological ROS  negative psych ROS   GI/Hepatic negative GI ROS, Neg liver ROS,   Endo/Other  negative endocrine ROS  Renal/GU negative Renal ROS  negative genitourinary   Musculoskeletal   Abdominal   Peds  Hematology negative hematology ROS (+)   Anesthesia Other Findings   Reproductive/Obstetrics negative OB ROS                            Anesthesia Physical Anesthesia Plan  ASA: 1  Anesthesia Plan: General   Post-op Pain Management: Tylenol PO (pre-op)*   Induction: Intravenous  PONV Risk Score and Plan: 4 or greater and Ondansetron, Dexamethasone and Midazolam  Airway Management Planned: LMA  Additional Equipment:   Intra-op Plan:   Post-operative Plan: Extubation in OR  Informed Consent: I have reviewed the patients History and Physical, chart, labs and discussed the procedure including the risks, benefits and alternatives for the proposed anesthesia with the patient or authorized representative who has indicated his/her understanding and acceptance.     Dental advisory given  Plan Discussed with: CRNA  Anesthesia Plan Comments:         Anesthesia Quick Evaluation

## 2022-01-19 NOTE — OR Nursing (Signed)
Anora Kit collected during procedure.

## 2022-01-19 NOTE — Anesthesia Procedure Notes (Signed)
Procedure Name: LMA Insertion Date/Time: 01/19/2022 9:28 AM  Performed by: De Nurse, CRNAPre-anesthesia Checklist: Patient identified, Emergency Drugs available, Suction available and Patient being monitored Patient Re-evaluated:Patient Re-evaluated prior to induction Oxygen Delivery Method: Circle System Utilized Preoxygenation: Pre-oxygenation with 100% oxygen Induction Type: IV induction Ventilation: Mask ventilation without difficulty LMA: LMA inserted LMA Size: 4.0 Number of attempts: 1 Placement Confirmation: positive ETCO2 Tube secured with: Tape Dental Injury: Teeth and Oropharynx as per pre-operative assessment

## 2022-01-19 NOTE — Anesthesia Postprocedure Evaluation (Signed)
Anesthesia Post Note  Patient: Alexis Erickson  Procedure(s) Performed: DILATATION AND EVACUATION (Uterus)     Patient location during evaluation: PACU Anesthesia Type: General Level of consciousness: awake and alert Pain management: pain level controlled Vital Signs Assessment: post-procedure vital signs reviewed and stable Respiratory status: spontaneous breathing, nonlabored ventilation and respiratory function stable Cardiovascular status: blood pressure returned to baseline and stable Postop Assessment: no apparent nausea or vomiting Anesthetic complications: no   No notable events documented.  Last Vitals:  Vitals:   01/19/22 1100 01/19/22 1110  BP: 134/80 121/86  Pulse: 82 80  Resp: 15 17  Temp:  (!) 36.2 C  SpO2: 100% 100%    Last Pain:  Vitals:   01/19/22 1110  TempSrc:   PainSc: 0-No pain                 Ineze Serrao,W. EDMOND

## 2022-01-20 ENCOUNTER — Encounter (HOSPITAL_COMMUNITY): Payer: Self-pay | Admitting: Obstetrics & Gynecology

## 2022-01-20 LAB — RHOGAM INJECTION: Unit division: 0

## 2022-01-20 LAB — SURGICAL PATHOLOGY

## 2022-02-02 ENCOUNTER — Telehealth: Payer: Self-pay

## 2022-02-02 NOTE — Telephone Encounter (Signed)
Anora results received showing normal female. Called pt; unable to leave VM. MyChart message sent.

## 2022-02-20 ENCOUNTER — Ambulatory Visit (INDEPENDENT_AMBULATORY_CARE_PROVIDER_SITE_OTHER): Payer: Medicaid Other | Admitting: Obstetrics & Gynecology

## 2022-02-20 ENCOUNTER — Other Ambulatory Visit (HOSPITAL_COMMUNITY)
Admission: RE | Admit: 2022-02-20 | Discharge: 2022-02-20 | Disposition: A | Discharge status: Home / Self Care | Payer: Medicaid Other | Source: Ambulatory Visit | Attending: Obstetrics & Gynecology | Admitting: Obstetrics & Gynecology

## 2022-02-20 ENCOUNTER — Encounter: Payer: Self-pay | Admitting: Obstetrics & Gynecology

## 2022-02-20 ENCOUNTER — Other Ambulatory Visit: Payer: Self-pay

## 2022-02-20 VITALS — BP 112/66 | Wt 122.2 lb

## 2022-02-20 DIAGNOSIS — O021 Missed abortion: Secondary | ICD-10-CM

## 2022-02-20 DIAGNOSIS — A64 Unspecified sexually transmitted disease: Secondary | ICD-10-CM

## 2022-02-20 DIAGNOSIS — A749 Chlamydial infection, unspecified: Secondary | ICD-10-CM

## 2022-02-20 DIAGNOSIS — O98811 Other maternal infectious and parasitic diseases complicating pregnancy, first trimester: Secondary | ICD-10-CM

## 2022-02-20 DIAGNOSIS — A5602 Chlamydial vulvovaginitis: Secondary | ICD-10-CM | POA: Diagnosis not present

## 2022-02-20 NOTE — Progress Notes (Signed)
Subjective:     Alexis Erickson is a 26 y.o. female who presents to the clinic 5 weeks status post  D&E  for  missed abortion . Eating a regular diet without difficulty. Bowel movements are normal. The patient is not having any pain.  The following portions of the patient's history were reviewed and updated as appropriate: allergies, current medications, past family history, past medical history, past social history, past surgical history, and problem list.  Review of Systems Pertinent items are noted in HPI.    Objective:    BP 112/66   Wt 122 lb 3.2 oz (55.4 kg)   LMP 11/13/2021 (Exact Date)   Breastfeeding Unknown   BMI 23.09 kg/m  General:  alert, cooperative, and no distress  Abdomen: soft, bowel sounds active, non-tender        Assessment:    Doing well postoperatively. Operative findings again reviewed. Pathology report discussed.    Plan:    1. Continue any current medications. 2. Wound care discussed. 3. Activity restrictions: none 4. Anticipated return to work: now. 5. Patient requests no contraception, encouraged her to take prenatal vitamin. Patient ID: Alexis Erickson, female   DOB: 1996/07/04, 26 y.o.   MRN: [id number]

## 2022-02-21 LAB — CERVICOVAGINAL ANCILLARY ONLY
Bacterial Vaginitis (gardnerella): NEGATIVE
Candida Glabrata: NEGATIVE
Candida Vaginitis: NEGATIVE
Chlamydia: POSITIVE — AB
Comment: NEGATIVE
Comment: NEGATIVE
Comment: NEGATIVE
Comment: NEGATIVE
Comment: NEGATIVE
Comment: NORMAL
Neisseria Gonorrhea: NEGATIVE
Trichomonas: NEGATIVE

## 2022-02-23 ENCOUNTER — Telehealth: Payer: Self-pay | Admitting: *Deleted

## 2022-02-23 DIAGNOSIS — O98811 Other maternal infectious and parasitic diseases complicating pregnancy, first trimester: Secondary | ICD-10-CM

## 2022-02-23 MED ORDER — AZITHROMYCIN 250 MG PO TABS: 1000.0000 mg | ORAL_TABLET | Freq: Once | ORAL | 0 refills | Status: AC

## 2022-02-23 NOTE — Telephone Encounter (Addendum)
Called pt and informed her of +Chlamydia result. Her prescription will be sent in today. Her partner will need to be treated as well and can contact his PCP or schedule appt @ [facility]. They will need to abstain from sex until 2 weeks after they both have been treated.  Pt stated that she was treated for this infection last month with Doxycycline and had several episodes of vomiting during the course of medication. Rx for Azithromycin sent today. Pt agreed to nurse visit on 05/22/22 for self swab TOC. She voiced understanding of all information and instructions given.  STD report completed and faxed to [facility]

## 2022-02-24 MED ORDER — AZITHROMYCIN 500 MG PO TABS: 1000.0000 mg | ORAL_TABLET | Freq: Once | ORAL | 0 refills | Status: AC

## 2022-02-24 NOTE — Addendum Note (Signed)
Addended by: Adam Phenix on: 02/24/2022 06:27 PM   Modules accepted: Orders

## 2022-02-24 NOTE — Progress Notes (Signed)
Meds ordered this encounter  Medications   azithromycin (ZITHROMAX) 500 MG tablet    Sig: Take 2 tablets (1,000 mg total) by mouth once for 1 dose.    Dispense:  2 tablet    Refill:  0

## 2022-05-22 ENCOUNTER — Ambulatory Visit (INDEPENDENT_AMBULATORY_CARE_PROVIDER_SITE_OTHER): Payer: Medicaid Other | Admitting: *Deleted

## 2022-05-22 ENCOUNTER — Other Ambulatory Visit: Payer: Self-pay

## 2022-05-22 ENCOUNTER — Other Ambulatory Visit (HOSPITAL_COMMUNITY)
Admission: RE | Admit: 2022-05-22 | Discharge: 2022-05-22 | Disposition: A | Discharge status: Home / Self Care | Payer: Medicaid Other | Source: Ambulatory Visit | Attending: Family Medicine | Admitting: Family Medicine

## 2022-05-22 VITALS — BP 116/73 | HR 58 | Ht 61.0 in | Wt 113.2 lb

## 2022-05-22 DIAGNOSIS — Z202 Contact with and (suspected) exposure to infections with a predominantly sexual mode of transmission: Secondary | ICD-10-CM | POA: Insufficient documentation

## 2022-05-22 DIAGNOSIS — N898 Other specified noninflammatory disorders of vagina: Secondary | ICD-10-CM

## 2022-05-22 NOTE — Progress Notes (Signed)
Here for Test of cure , had chlamydia in July. States she is not having symptoms but is not sure they waited long enough after  they were both treated last time. Self swab obtained and explained she will be contacted if results +. She voices understanding. Staci Acosta

## 2022-05-23 LAB — CERVICOVAGINAL ANCILLARY ONLY
Bacterial Vaginitis (gardnerella): NEGATIVE
Candida Glabrata: NEGATIVE
Candida Vaginitis: NEGATIVE
Chlamydia: NEGATIVE
Comment: NEGATIVE
Comment: NEGATIVE
Comment: NEGATIVE
Comment: NEGATIVE
Comment: NEGATIVE
Comment: NORMAL
Neisseria Gonorrhea: NEGATIVE
Trichomonas: NEGATIVE

## 2022-07-12 ENCOUNTER — Telehealth: Payer: Self-pay

## 2022-07-12 DIAGNOSIS — O0993 Supervision of high risk pregnancy, unspecified, third trimester: Secondary | ICD-10-CM | POA: Insufficient documentation

## 2022-07-12 DIAGNOSIS — Z3491 Encounter for supervision of normal pregnancy, unspecified, first trimester: Secondary | ICD-10-CM

## 2022-07-12 DIAGNOSIS — Z3490 Encounter for supervision of normal pregnancy, unspecified, unspecified trimester: Secondary | ICD-10-CM | POA: Insufficient documentation

## 2022-07-12 NOTE — Telephone Encounter (Signed)
Pt call transferred from the front office.  Pt states that she just found out she is pregnant and had a SAB three months ago.  Pt states that she is concerned about this pregnancy due to her history.  Pt reports that her LMP is 05/29/22 and she has regular periods.  Pt informed that her  EDD 03/05/23.  Pt denies bleeding and pain.  Pt advised to come into office to do a walk in pregnancy, the clinical staff will call her her, in which we can schedule a viability scan in 2-3 week due to hx of SAB.  I explained to the pt that miscarriages are not preventable and to stay in positive thoughts as she goes through this new journey.  Pt verbalized understanding with no further questions.   Alexis Erickson  07/12/22

## 2022-07-13 ENCOUNTER — Ambulatory Visit (INDEPENDENT_AMBULATORY_CARE_PROVIDER_SITE_OTHER): Payer: Medicaid Other

## 2022-07-13 DIAGNOSIS — O3680X0 Pregnancy with inconclusive fetal viability, not applicable or unspecified: Secondary | ICD-10-CM

## 2022-07-13 DIAGNOSIS — Z3A00 Weeks of gestation of pregnancy not specified: Secondary | ICD-10-CM

## 2022-07-13 LAB — POCT PREGNANCY, URINE: Preg Test, Ur: POSITIVE — AB

## 2022-07-13 NOTE — Progress Notes (Unsigned)
Pt left urine for urine pregnancy test resulting positive.  Pt scheduled for 07/27/22 for a viability [place] due to her hx of miscarriage.  Per RN note from 07/12/22.  Pt was advised to come in for walk in pregnancy and that a viability [place] would be scheduled.  Pt received appt via MyChart.   Leonette Nutting

## 2022-07-18 ENCOUNTER — Encounter (HOSPITAL_COMMUNITY): Payer: Self-pay | Admitting: Obstetrics and Gynecology

## 2022-07-18 ENCOUNTER — Inpatient Hospital Stay (HOSPITAL_COMMUNITY)
Admission: AD | Admit: 2022-07-18 | Discharge: 2022-07-18 | Disposition: A | Discharge status: Home / Self Care | Payer: Medicaid Other | Attending: Obstetrics and Gynecology | Admitting: Obstetrics and Gynecology

## 2022-07-18 ENCOUNTER — Inpatient Hospital Stay (HOSPITAL_COMMUNITY): Payer: Medicaid Other

## 2022-07-18 DIAGNOSIS — Z3A01 Less than 8 weeks gestation of pregnancy: Secondary | ICD-10-CM | POA: Diagnosis not present

## 2022-07-18 DIAGNOSIS — O26891 Other specified pregnancy related conditions, first trimester: Secondary | ICD-10-CM | POA: Insufficient documentation

## 2022-07-18 DIAGNOSIS — R824 Acetonuria: Secondary | ICD-10-CM | POA: Insufficient documentation

## 2022-07-18 DIAGNOSIS — Z3491 Encounter for supervision of normal pregnancy, unspecified, first trimester: Secondary | ICD-10-CM

## 2022-07-18 DIAGNOSIS — Z3490 Encounter for supervision of normal pregnancy, unspecified, unspecified trimester: Secondary | ICD-10-CM

## 2022-07-18 DIAGNOSIS — O219 Vomiting of pregnancy, unspecified: Secondary | ICD-10-CM | POA: Insufficient documentation

## 2022-07-18 DIAGNOSIS — R102 Pelvic and perineal pain: Secondary | ICD-10-CM | POA: Diagnosis not present

## 2022-07-18 HISTORY — DX: Other specified health status: Z78.9

## 2022-07-18 LAB — CBC
HCT: 37.2 % (ref 36.0–46.0)
Hemoglobin: 13.2 g/dL (ref 12.0–15.0)
MCH: 28.8 pg (ref 26.0–34.0)
MCHC: 35.5 g/dL (ref 30.0–36.0)
MCV: 81.2 fL (ref 80.0–100.0)
Platelets: 172 10*3/uL (ref 150–400)
RBC: 4.58 MIL/uL (ref 3.87–5.11)
RDW: 14 % (ref 11.5–15.5)
WBC: 10.5 10*3/uL (ref 4.0–10.5)
nRBC: 0 % (ref 0.0–0.2)

## 2022-07-18 LAB — URINALYSIS, ROUTINE W REFLEX MICROSCOPIC
Bilirubin Urine: NEGATIVE
Glucose, UA: NEGATIVE mg/dL
Hgb urine dipstick: NEGATIVE
Ketones, ur: 80 mg/dL — AB
Nitrite: NEGATIVE
Protein, ur: NEGATIVE mg/dL
Specific Gravity, Urine: 1.02 (ref 1.005–1.030)
pH: 6 (ref 5.0–8.0)

## 2022-07-18 LAB — WET PREP, GENITAL
Clue Cells Wet Prep HPF POC: NONE SEEN
Sperm: NONE SEEN
Trich, Wet Prep: NONE SEEN
WBC, Wet Prep HPF POC: >=10 — AB (ref <10)
Yeast Wet Prep HPF POC: NONE SEEN

## 2022-07-18 LAB — HCG, QUANTITATIVE, PREGNANCY: hCG, Beta Chain, Quant, S: 123645 m[IU]/mL — ABNORMAL HIGH (ref <5)

## 2022-07-18 MED ORDER — ONDANSETRON 4 MG PO TBDP: 4.0000 mg | ORAL_TABLET | Freq: Four times a day (QID) | ORAL | 0 refills | Status: DC | PRN

## 2022-07-18 MED ORDER — LACTATED RINGERS IV BOLUS
1000.0000 mL | Freq: Once | INTRAVENOUS | Status: AC
  Administered 2022-07-18: 1000 mL via INTRAVENOUS

## 2022-07-18 MED ORDER — LACTATED RINGERS IV SOLN
Freq: Once | INTRAVENOUS | Status: AC
  Filled 2022-07-18: qty 10

## 2022-07-18 NOTE — Discharge Instructions (Signed)
Vomiting in First Trimester Follow these instructions at home: To help relieve your symptoms, listen to your body. Everyone is different and has different preferences. Find what works best for you. Here are some things you can try to help relieve your symptoms: Meals and snacks Eat 5-6 small meals daily instead of 3 large meals. Eating small meals and snacks can help you avoid an empty stomach. Before getting out of bed, eat a couple of crackers to avoid moving around on an empty stomach. Eat a protein-rich snack before bed. Examples include cheese and crackers, or a peanut butter sandwich made with 1 slice of whole-wheat bread and 1 tsp (5 g) of peanut butter. Eat and drink slowly. Try eating starchy foods as these are usually tolerated well. Examples include cereal, toast, bread, potatoes, pasta, rice, and pretzels. Eat at least one serving of protein with your meals and snacks. Protein options include lean meats, poultry, seafood, beans, nuts, nut butters, eggs, cheese, and yogurt. Eat or suck on things that have ginger in them. It may help to relieve nausea. Add  tsp (0.44 g) ground ginger to hot tea, or choose ginger tea.   Fluids It is important to stay hydrated. Try to: Drink small amounts of fluids often. Drink fluids 30 minutes before or after a meal to help lessen the feeling of a full stomach. Drink 100% fruit juice or an electrolyte drink. An electrolyte drink contains sodium, potassium, and chloride. Drink fluids that are cold, clear, and carbonated or sour. These include lemonade, ginger ale, lemon-lime soda, ice water, and sparkling water. Things to avoid Avoid the following: Eating foods that trigger your symptoms. These may include spicy foods, coffee, high-fat foods, very sweet foods, and acidic foods. Drinking more than 1 cup of fluid at a time. Skipping meals. Nausea can be more intense on an empty stomach. If you cannot tolerate food, do not force it. Try sucking on ice  chips or other frozen items and make up for missed calories later. Lying down within 2 hours after eating. Being exposed to environmental triggers. These may include food smells, smoky rooms, closed spaces, rooms with strong smells, warm or humid places, overly loud and noisy rooms, and rooms with motion or flickering lights. Try eating meals in a well-ventilated area that is free of strong smells. Making quick and sudden changes in your movement. Taking iron pills and multivitamins that contain iron. If you take prescription iron pills, do not stop taking them unless your health care provider approves. Preparing food. The smell of food can spoil your appetite or trigger nausea. General instructions Brush your teeth or use a mouth rinse after meals. Take over-the-counter and prescription medicines only as told by your health care provider. Follow instructions from your health care provider about eating or drinking restrictions. Talk with your health care provider about starting a supplement of vitamin B6. Continue to take your prenatal vitamins as told by your health care provider. If you are having trouble taking your prenatal vitamins, talk with your health care provider about other options. Keep all follow-up visits. This is important. Follow-up visits include prenatal visits. Contact a health care provider if: You have pain in your abdomen. You have a severe headache. You have vision problems. You are losing weight. You feel weak or dizzy. You cannot eat or drink without vomiting, especially if this goes on for a full day. Get help right away if: You cannot drink fluids without vomiting. You vomit blood. You have constant   nausea and vomiting. You are very weak. You faint. You have a fever and your symptoms suddenly get worse. Summary Making some changes to your eating habits may help relieve nausea and vomiting. This condition may be managed with lifestyle changes and medicines as  prescribed by your health care provider. If medicines do not help relieve nausea and vomiting, you may need to receive fluids through an IV at the hospital. This information is not intended to replace advice given to you by your health care provider. Make sure you discuss any questions you have with your health care provider. Document Revised: 02/16/2020 Document Reviewed: 02/16/2020 Elsevier Patient Education  2021 [employer]  

## 2022-07-18 NOTE — MAU Provider Note (Signed)
History     CSN: [id number]  Arrival date and time: 07/18/22 1148   Event Date/Time   First Provider Initiated Contact with Patient 07/18/22 1317      Chief Complaint  Patient presents with   Nausea   HPI Alexis Erickson is a 26 y.o. G2P0010 at [redacted]w[redacted]d by LMP who presents to MAU with chief complaints of persistent nausea, occasional vomiting, and mild lower abdominal cramping. These are new problems, onset 2-3 weeks ago. Abdominal cramping is typically mild with pain score 2-3/10. She denies vaginal bleeding, abnormal vaginal discharge, dysuria, fever or recent illness.  OB History     Gravida  2   Para      Term      Preterm      AB  1   Living         SAB  1   IAB      Ectopic      Multiple      Live Births              Past Medical History:  Diagnosis Date   Chlamydia infection 01/09/2022   Medical history non-contributory     Past Surgical History:  Procedure Laterality Date   DILATION AND EVACUATION N/A 01/19/2022   Procedure: DILATATION AND EVACUATION;  Surgeon: Tereso Newcomer, MD;  Location: [facility];  Service: Gynecology;  Laterality: N/A;   TOOTH EXTRACTION      Family History  Problem Relation Age of Onset   Diabetes Mother    Cancer Maternal Grandmother        breast    Social History   Tobacco Use   Smoking status: Never   Smokeless tobacco: Never  Vaping Use   Vaping Use: Never used  Substance Use Topics   Alcohol use: Not Currently    Comment: occasional wine   Drug use: Not Currently    Types: Marijuana    Comment: Last use Early May 2023    Allergies: No Known Allergies  Medications Prior to Admission  Medication Sig Dispense Refill Last Dose   Prenatal Vit-Fe Fumarate-FA (PRENATAL MULTIVITAMIN) TABS tablet Take 1 tablet by mouth daily at 12 noon.      docusate sodium (COLACE) 100 MG capsule Take 1 capsule (100 mg total) by mouth 2 (two) times daily as needed for mild constipation or moderate constipation.  (Patient not taking: Reported on 07/18/2022) 30 capsule 2 Not Taking   ibuprofen (ADVIL) 600 MG tablet Take 1 tablet (600 mg total) by mouth every 6 (six) hours as needed for headache, mild pain, moderate pain or cramping. (Patient not taking: Reported on 07/18/2022) 30 tablet 2 Not Taking   oxyCODONE (OXY IR/ROXICODONE) 5 MG immediate release tablet Take 1 tablet (5 mg total) by mouth every 4 (four) hours as needed for severe pain or breakthrough pain. (Patient not taking: Reported on 07/18/2022) 15 tablet 0 Not Taking    Review of Systems  Gastrointestinal:  Positive for abdominal pain, nausea and vomiting.  All other systems reviewed and are negative.  Physical Exam   Blood pressure (!) 109/54, pulse 88, temperature 98 F (36.7 C), resp. rate 17, height 5\' 1"  (1.549 m), weight 49.9 kg, last menstrual period 05/29/2022, SpO2 100 %, unknown if currently breastfeeding.  Physical Exam Vitals and nursing note reviewed. Exam conducted with a chaperone present.  Constitutional:      Appearance: Normal appearance. She is not ill-appearing.  Cardiovascular:     Rate and  Rhythm: Normal rate and regular rhythm.     Pulses: Normal pulses.     Heart sounds: Normal heart sounds.  Pulmonary:     Effort: Pulmonary effort is normal.     Breath sounds: Normal breath sounds.  Abdominal:     General: Abdomen is flat.     Tenderness: There is no right CVA tenderness or left CVA tenderness.  Skin:    Capillary Refill: Capillary refill takes less than 2 seconds.  Neurological:     Mental Status: She is alert and oriented to person, place, and time.  Psychiatric:        Mood and Affect: Mood normal.        Behavior: Behavior normal.        Thought Content: Thought content normal.        Judgment: Judgment normal.     MAU Course  Procedures  MDM Orders Placed This Encounter  Procedures   Wet prep, genital   US OB Comp Less 14 Wks   Urinalysis, Routine w reflex microscopic Urine, Clean Catch    CBC   hCG, quantitative, pregnancy   Insert peripheral IV   Discharge patient   Patient Vitals for the past 24 hrs:  BP Temp Pulse Resp SpO2 Height Weight  07/18/22 1508 (!) 102/52 -- 70 -- -- -- --  07/18/22 1439 (!) 109/54 -- 88 -- -- -- --  07/18/22 1438 (!) 75/50 -- 96 17 100 % -- --  07/18/22 1214 117/68 98 F (36.7 C) 72 18 -- 5\' 1"  (1.549 m) 49.9 kg   Results for orders placed or performed during the hospital encounter of 07/18/22 (from the past 24 hour(s))  Urinalysis, Routine w reflex microscopic Urine, Clean Catch     Status: Abnormal   Collection Time: 07/18/22 12:36 PM  Result Value Ref Range   Color, Urine YELLOW YELLOW   APPearance HAZY (A) CLEAR   Specific Gravity, Urine 1.020 1.005 - 1.030   pH 6.0 5.0 - 8.0   Glucose, UA NEGATIVE NEGATIVE mg/dL   Hgb urine dipstick NEGATIVE NEGATIVE   Bilirubin Urine NEGATIVE NEGATIVE   Ketones, ur 80 (A) NEGATIVE mg/dL   Protein, ur NEGATIVE NEGATIVE mg/dL   Nitrite NEGATIVE NEGATIVE   Leukocytes,Ua LARGE (A) NEGATIVE   RBC / HPF 0-5 0 - 5 RBC/hpf   WBC, UA 21-50 0 - 5 WBC/hpf   Bacteria, UA FEW (A) NONE SEEN   Squamous Epithelial / LPF 11-20 0 - 5   Mucus PRESENT   Wet prep, genital     Status: Abnormal   Collection Time: 07/18/22 12:36 PM   Specimen: Vaginal  Result Value Ref Range   Yeast Wet Prep HPF POC NONE SEEN NONE SEEN   Trich, Wet Prep NONE SEEN NONE SEEN   Clue Cells Wet Prep HPF POC NONE SEEN NONE SEEN   WBC, Wet Prep HPF POC >=10 (A) <10   Sperm NONE SEEN   CBC     Status: None   Collection Time: 07/18/22 12:43 PM  Result Value Ref Range   WBC 10.5 4.0 - 10.5 K/uL   RBC 4.58 3.87 - 5.11 MIL/uL   Hemoglobin 13.2 12.0 - 15.0 g/dL   HCT 14/12/23 73.7 - 10.6 %   MCV 81.2 80.0 - 100.0 fL   MCH 28.8 26.0 - 34.0 pg   MCHC 35.5 30.0 - 36.0 g/dL   RDW 26.9 48.5 - 46.2 %   Platelets 172 150 - 400 K/uL  nRBC 0.0 0.0 - 0.2 %  hCG, quantitative, pregnancy     Status: Abnormal   Collection Time: 07/18/22 12:43  PM  Result Value Ref Range   hCG, Beta Chain, Quant, S 123,645 (H) <5 mIU/mL   US OB Comp Less 14 Wks  Result Date: 07/18/2022 CLINICAL DATA:  Abdominal cramping affecting pregnancy, pregnancy of unknown anatomic location, LMP 05/29/2022 EXAM: OBSTETRIC <14 WK ULTRASOUND TECHNIQUE: Transabdominal ultrasound was performed for evaluation of the gestation as well as the maternal uterus and adnexal regions. COMPARISON:  None for this gestation FINDINGS: Intrauterine gestational sac: Present, single Yolk sac:  Present Embryo:  Present Cardiac Activity: Present Heart Rate: 127 bpm CRL:   11.2 mm   7 w 1 d                  [place] EDC: 03/05/2023 Subchorionic hemorrhage:  None visualized. Maternal uterus/adnexae: Remainder of uterus unremarkable. LEFT ovary normal size and morphology, 2.6 x 2.0 x 2.2 cm. RIGHT ovary measures 4.1 x 2.6 x 2.7 cm and contains a small corpus luteal cyst. No free pelvic fluid or adnexal masses. IMPRESSION: Single live intrauterine gestation at 7 weeks 1 day EGA by crown-rump length. No acute abnormalities. Electronically Signed   By: Ulyses Southward M.D.   On: 07/18/2022 13:29    Meds ordered this encounter  Medications   lactated ringers bolus 1,000 mL   M.V.I. Adult (INFUVITE ADULT) 10 mL in lactated ringers 1,000 mL infusion   ondansetron (ZOFRAN-ODT) 4 MG disintegrating tablet    Sig: Take 1 tablet (4 mg total) by mouth every 6 (six) hours as needed for nausea.    Dispense:  20 tablet    Refill:  0    Order Specific Question:   Supervising Provider    Answer:   Milas Hock [[id number]]   Assessment and Plan  --26 y.o. G2P0010 with SIUP measuring [redacted]w[redacted]d c/w LMP --Vomiting with Ketonuria --S/p IV rehydration --No episodes of vomiting during MAU encounter --Initiate Zofran as primary antiemetic PRN --Discharge home in stable condition with first trimester precautions  Calvert Cantor, MSA, MSN, CNM 07/18/2022, 4:52 PM

## 2022-07-18 NOTE — MAU Note (Signed)
.  Alexis Erickson is a 26 y.o. at Unknown here in MAU reporting: feeling nauseated all the time and vomiting in the morning (not today) just nauseated. Has had some cramping on and off as well. Wants to make sure everything is "ok"denies andy vag bleeding or discharge.  LMP: 05/29/22 Onset of complaint: 2-3 weeks Pain score: 2 Vitals:   07/18/22 1214  BP: 117/68  Pulse: 72  Resp: 18  Temp: 98 F (36.7 C)     FHT:n/a Lab orders placed from triage:  u/a

## 2022-07-19 LAB — GC/CHLAMYDIA PROBE AMP (~~LOC~~) NOT AT ARMC
Chlamydia: NEGATIVE
Comment: NEGATIVE
Comment: NORMAL
Neisseria Gonorrhea: NEGATIVE

## 2022-07-27 ENCOUNTER — Other Ambulatory Visit: Payer: Self-pay | Admitting: Family Medicine

## 2022-07-27 ENCOUNTER — Ambulatory Visit (HOSPITAL_COMMUNITY): Payer: Medicaid Other

## 2022-07-27 ENCOUNTER — Telehealth: Payer: Self-pay | Admitting: General Practice

## 2022-07-27 ENCOUNTER — Ambulatory Visit
Admission: RE | Admit: 2022-07-27 | Discharge: 2022-07-27 | Disposition: A | Discharge status: Home / Self Care | Payer: Medicaid Other | Source: Ambulatory Visit | Attending: Family Medicine | Admitting: Family Medicine

## 2022-07-27 DIAGNOSIS — Z3A08 8 weeks gestation of pregnancy: Secondary | ICD-10-CM | POA: Diagnosis not present

## 2022-07-27 DIAGNOSIS — Z3689 Encounter for other specified antenatal screening: Secondary | ICD-10-CM | POA: Diagnosis not present

## 2022-07-27 DIAGNOSIS — O3680X0 Pregnancy with inconclusive fetal viability, not applicable or unspecified: Secondary | ICD-10-CM | POA: Insufficient documentation

## 2022-07-27 NOTE — Telephone Encounter (Signed)
Called patient discussed ultrasound scheduled today at 3pm. Told patient the ultrasound isn't necessary since her ultrasound last week was normal but given her history she can still have it today. Told patient she will not be able to see the ultrasound screen but should be given pictures. Patient verbalized understanding & asked about getting future appts scheduled. She desires to start care with [place]. Told patient I would reach out to the front office and someone will contact her with a new OB & new OB intake appt. Patient verbalized understanding.

## 2022-08-07 NOTE — L&D Delivery Note (Signed)
Delivery Note Alexis Erickson is a 27 y.o. G2P0010 at [redacted]w[redacted]d admitted for IOL for A1DM, mild preeclampsia.   GBS Status:  Negative/-- (07/08 0430)  Labor course: Initial SVE: 3/think/-3. Augmentation with: AROM, Pitocin, and Cytotec. She then progressed to complete.  ROM: 20h 91m with clear fluid  Birth: After a 45 minute 2nd stage, she delivered a Live born female  Birth Weight:   APGAR: 9, 9  Newborn Delivery   Birth date/time: 02/27/2023 05:26:32 Delivery type: Vaginal, Spontaneous        Delivered via spontaneous vaginal delivery (Presentation: ROA ). Nuchal cord present: No. . Shoulders and body delivered in usual fashion. Infant placed directly on mom's abdomen for bonding/skin-to-skin, baby dried and stimulated. Cord clamped x 2 after 1 minute and cut by FOB.  Cord blood collected. Placenta delivered-Spontaneous with 3 vessels. 20u Pitocin in 500cc LR given as a bolus prior to delivery of placenta.  Fundus firm with massage. Placenta inspected and appears to be intact with a 3 VC.  Sponge and instrument count were correct x2.  Intrapartum complications:  None Anesthesia:  epidural Lacerations:  1st degree Suture Repair: n/a EBL (mL):75.00    Mom to postpartum.  Baby to Couplet care / Skin to Skin. Placenta to L&D   Plans to Breastfeed Contraception: oral progesterone-only contraceptive Circumcision: wants inpatient  Note sent to [facility]:  [facility]  for pp visit.  Delivery Report:  Review the Delivery Report for details.     Signed: Jacklyn Shell, DNP,CNM 02/27/2023, 5:52 AM

## 2022-08-24 ENCOUNTER — Telehealth (INDEPENDENT_AMBULATORY_CARE_PROVIDER_SITE_OTHER): Payer: Medicaid Other

## 2022-08-24 DIAGNOSIS — Z3491 Encounter for supervision of normal pregnancy, unspecified, first trimester: Secondary | ICD-10-CM

## 2022-08-24 DIAGNOSIS — Q985 Karyotype 47, XYY: Secondary | ICD-10-CM

## 2022-08-24 DIAGNOSIS — Z3689 Encounter for other specified antenatal screening: Secondary | ICD-10-CM

## 2022-08-24 MED ORDER — GOJJI WEIGHT SCALE MISC: 1.0000 | 0 refills | Status: DC | PRN

## 2022-08-24 MED ORDER — BLOOD PRESSURE KIT DEVI: 1.0000 | 0 refills | Status: DC | PRN

## 2022-08-24 NOTE — Progress Notes (Signed)
New OB Intake  I connected with Alexis Erickson  on 08/24/22 at 10:15 AM EST by MyChart Video Visit and verified that I am speaking with the correct person using two identifiers. Nurse is located at [facility] and pt is located at home.  I discussed the limitations, risks, security and privacy concerns of performing an evaluation and management service by telephone and the availability of in person appointments. I also discussed with the patient that there may be a patient responsible charge related to this service. The patient expressed understanding and agreed to proceed.  I explained I am completing New OB Intake today. We discussed EDD of 03/03/2023 that is based on ultrasound of 07/27/2022. Pt is G2/P0. I reviewed her allergies, medications, Medical/Surgical/OB history, and appropriate screenings. I informed her of [facility] services. [facility] information placed in AVS. Based on history, this is a low risk pregnancy.   Concerns addressed today  Delivery Plans Plans to deliver at [facility]. Patient given information for [facility] Healthy Baby website for more information about Women's and [place]. Patient is not interested in water birth. Offered upcoming OB visit with CNM to discuss further.  MyChart/Babyscripts MyChart access verified. I explained pt will have some visits in office and some virtually. Babyscripts instructions given and order placed. Patient verifies receipt of registration text/e-mail. Account successfully created and app downloaded.  Blood Pressure Cuff/Weight Scale Blood pressure cuff ordered for patient to pick-up from [employer]. Explained after first prenatal appt pt will check weekly and document in 3. Patient does not have weight scale; order sent to [place], patient may track weight weekly in Babyscripts.  Anatomy US Explained first scheduled [place] will be around 19 weeks. Anatomy US scheduled for 10/13/2022 at 09:45 AM. Pt notified to arrive at  09:30 AM.  Labs Discussed Natera genetic screening with patient. Would like both Panorama and Horizon drawn at new OB visit. Routine prenatal labs needed.  COVID Vaccine Patient has had COVID vaccine.   Is patient a CenteringPregnancy candidate?  Declined Declined due to Schedule    Is patient a Mom+Baby Combined Care candidate?  Accepted   If accepted, Mom+Baby staff notified  Social Determinants of Health Food Insecurity: Patient denies food insecurity. WIC Referral: Patient is not interested in referral to [facility].  Transportation: Patient denies transportation needs. Childcare: Discussed no children allowed at ultrasound appointments. Offered childcare services; patient declines childcare services at this time.  First visit review I reviewed new OB appt with patient. I explained they will have a provider visit that includes initial ob labs, genetic screening, GC/CH, OB Culture, and Hemoglobin A1C. Explained pt will be seen by Clarnce Flock MD at first visit; encounter routed to appropriate provider. Explained that patient will be seen by pregnancy navigator following visit with provider.   Mariane Baumgarten, [place] 08/24/2022  10:13 AM

## 2022-08-31 ENCOUNTER — Encounter: Payer: Medicaid Other | Admitting: Family Medicine

## 2022-09-01 ENCOUNTER — Encounter: Payer: Self-pay | Admitting: Family Medicine

## 2022-09-01 ENCOUNTER — Other Ambulatory Visit (HOSPITAL_COMMUNITY)
Admission: RE | Admit: 2022-09-01 | Discharge: 2022-09-01 | Disposition: A | Discharge status: Home / Self Care | Payer: Medicaid Other | Source: Ambulatory Visit | Attending: Family Medicine | Admitting: Family Medicine

## 2022-09-01 ENCOUNTER — Ambulatory Visit (INDEPENDENT_AMBULATORY_CARE_PROVIDER_SITE_OTHER): Payer: Medicaid Other | Admitting: Family Medicine

## 2022-09-01 VITALS — BP 125/77 | HR 98 | Wt 120.0 lb

## 2022-09-01 DIAGNOSIS — Z3492 Encounter for supervision of normal pregnancy, unspecified, second trimester: Secondary | ICD-10-CM | POA: Insufficient documentation

## 2022-09-01 DIAGNOSIS — Z6791 Unspecified blood type, Rh negative: Secondary | ICD-10-CM | POA: Insufficient documentation

## 2022-09-01 NOTE — Patient Instructions (Signed)

## 2022-09-01 NOTE — Progress Notes (Signed)
Subjective:   Alexis Erickson is a 27 y.o. G2P0010 at [redacted]w[redacted]d by early ultrasound being seen today for her first obstetrical visit.  Her obstetrical history is significant for  n/a . Patient does intend to breast feed. Pregnancy history fully reviewed.  Patient reports no complaints.  HISTORY: OB History  Gravida Para Term Preterm AB Living  2 0 0 0 1 0  SAB IAB Ectopic Multiple Live Births  1 0 0 0 0    # Outcome Date GA Lbr Len/2nd Weight Sex Delivery Anes PTL Lv  2 Current           1 SAB              Last pap smear: No results found for: "DIAGPAP", "HPV", "[place]" Collected today  Past Medical History:  Diagnosis Date   Chlamydia infection 01/09/2022   Medical history non-contributory    Past Surgical History:  Procedure Laterality Date   DILATION AND EVACUATION N/A 01/19/2022   Procedure: DILATATION AND EVACUATION;  Surgeon: Osborne Oman, MD;  Location: [place];  Service: Gynecology;  Laterality: N/A;   TOOTH EXTRACTION     Family History  Problem Relation Age of Onset   Diabetes Mother    Cancer Maternal Grandmother        breast   Social History   Tobacco Use   Smoking status: Never   Smokeless tobacco: Never  Vaping Use   Vaping Use: Never used  Substance Use Topics   Alcohol use: Not Currently    Comment: occasional wine   Drug use: Not Currently    Types: Marijuana    Comment: Last use Early May 2023   No Known Allergies Current Outpatient Medications on File Prior to Visit  Medication Sig Dispense Refill   Prenatal Vit-Fe Fumarate-FA (PRENATAL MULTIVITAMIN) TABS tablet Take 1 tablet by mouth daily at 12 noon.     No current facility-administered medications on file prior to visit.     Exam   Vitals:   09/01/22 0918  BP: 125/77  Pulse: 98  Weight: 120 lb (54.4 kg)   Fetal Heart Rate (bpm): 140  Uterus:     Pelvic Exam: Perineum: no hemorrhoids, normal perineum   Vulva: normal external genitalia, no lesions    Vagina:  normal mucosa mild amount of white discharge but denies any symptoms   Cervix: no lesions and normal, pap smear done.   System: General: well-developed, well-nourished female in no acute distress   Skin: normal coloration and turgor, no rashes   Neurologic: oriented, normal, negative, normal mood   Extremities: normal strength, tone, and muscle mass, ROM of all joints is normal   HEENT PERRLA, extraocular movement intact and sclera clear, anicteric   Neck supple and no masses   Respiratory:  no respiratory distress      Assessment:   Pregnancy: G2P0010 Patient Active Problem List   Diagnosis Date Noted   Supervision of low-risk pregnancy 07/12/2022     Plan:  1. Encounter for supervision of low-risk pregnancy in second trimester BP and FHR normal Initial labs drawn. Continue prenatal vitamins. Genetic Screening discussed, NIPS: ordered. Ultrasound discussed; fetal anatomic survey: ordered. Problem list reviewed and updated. The nature of [facility]/Family Care clinic was explained to patient; Voiced they may need to be seen by other [facility] providers which includes family medicine physicians, OB GYNs, and APPs. Delivery will hopefully be with one of the [facility] providers or another Family Medicine  physician and we cannot promise this at this time.  Discussed there are [facility] staff in the hospital 24-7 and they understand and support this model and there is a likelihood one of these providers will catch their baby.  We also discussed that the service includes learners (residents, student) and they will be involved in the care team.   Routine obstetric precautions reviewed. Return in 4 weeks (on 09/29/2022) for [facility] patient, ob visit.

## 2022-09-02 LAB — CBC/D/PLT+RPR+RH+ABO+RUBIGG...
Antibody Screen: NEGATIVE
Basophils Absolute: 0.1 10*3/uL (ref 0.0–0.2)
Basos: 1 %
EOS (ABSOLUTE): 0.2 10*3/uL (ref 0.0–0.4)
Eos: 2 %
HCV Ab: NONREACTIVE
HIV Screen 4th Generation wRfx: NONREACTIVE
Hematocrit: 36.6 % (ref 34.0–46.6)
Hemoglobin: 11.9 g/dL (ref 11.1–15.9)
Hepatitis B Surface Ag: NEGATIVE
Immature Grans (Abs): 0.1 10*3/uL (ref 0.0–0.1)
Immature Granulocytes: 1 %
Lymphocytes Absolute: 2 10*3/uL (ref 0.7–3.1)
Lymphs: 19 %
MCH: 28.4 pg (ref 26.6–33.0)
MCHC: 32.5 g/dL (ref 31.5–35.7)
MCV: 87 fL (ref 79–97)
Monocytes Absolute: 1 10*3/uL — ABNORMAL HIGH (ref 0.1–0.9)
Monocytes: 9 %
Neutrophils Absolute: 7.1 10*3/uL — ABNORMAL HIGH (ref 1.4–7.0)
Neutrophils: 68 %
Platelets: 155 10*3/uL (ref 150–450)
RBC: 4.19 x10E6/uL (ref 3.77–5.28)
RDW: 13.6 % (ref 11.7–15.4)
RPR Ser Ql: NONREACTIVE
Rh Factor: NEGATIVE
Rubella Antibodies, IGG: 16.3 index (ref >0.99)
WBC: 10.3 10*3/uL (ref 3.4–10.8)

## 2022-09-02 LAB — HEMOGLOBIN A1C
Est. average glucose Bld gHb Est-mCnc: 111 mg/dL
Hgb A1c MFr Bld: 5.5 % (ref 4.8–5.6)

## 2022-09-02 LAB — HCV INTERPRETATION

## 2022-09-03 LAB — CULTURE, OB URINE

## 2022-09-03 LAB — URINE CULTURE, OB REFLEX

## 2022-09-04 ENCOUNTER — Encounter: Payer: Self-pay | Admitting: *Deleted

## 2022-09-05 LAB — CYTOLOGY - PAP
Chlamydia: NEGATIVE
Comment: NEGATIVE
Comment: NEGATIVE
Comment: NEGATIVE
Comment: NORMAL
Diagnosis: NEGATIVE
High risk HPV: POSITIVE — AB
Neisseria Gonorrhea: NEGATIVE
Trichomonas: NEGATIVE

## 2022-09-06 ENCOUNTER — Encounter: Payer: Self-pay | Admitting: Family Medicine

## 2022-09-06 DIAGNOSIS — B977 Papillomavirus as the cause of diseases classified elsewhere: Secondary | ICD-10-CM | POA: Insufficient documentation

## 2022-09-08 ENCOUNTER — Encounter: Payer: Self-pay | Admitting: Family Medicine

## 2022-09-08 DIAGNOSIS — R898 Other abnormal findings in specimens from other organs, systems and tissues: Secondary | ICD-10-CM | POA: Insufficient documentation

## 2022-09-08 LAB — PANORAMA PRENATAL TEST FULL PANEL:PANORAMA TEST PLUS 5 ADDITIONAL MICRODELETIONS: FETAL FRACTION: 14.4

## 2022-09-09 LAB — HORIZON CUSTOM: REPORT SUMMARY: NEGATIVE

## 2022-09-11 ENCOUNTER — Telehealth: Payer: Self-pay

## 2022-09-11 NOTE — Telephone Encounter (Signed)
-----   Message from Clarnce Flock, MD sent at 09/08/2022 11:59 AM EST ----- Abnormal Panorama suggestive of XYY genetics, please notify patient and refer to MFM genetics for counseling

## 2022-09-11 NOTE — Telephone Encounter (Signed)
Ordered placed for MFM genetic referral and appt made for 10/16/22 at 2:15 pm.   Call placed to pt. Spoke with pt. Pt given results per Dr Dione Plover. Pt verbalized understanding. [name], RNC

## 2022-09-11 NOTE — Addendum Note (Signed)
Addended by: Georgia Lopes on: 09/11/2022 02:23 PM   Modules accepted: Orders

## 2022-09-21 ENCOUNTER — Ambulatory Visit: Payer: Medicaid Other | Attending: Obstetrics and Gynecology

## 2022-09-21 NOTE — Progress Notes (Signed)
[facility] at [facility] 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   EDD: 03/03/2023 LMP: 05/29/2022 Referring Provider:  Caren Erickson  EGA: [id number] Counselor: Alexis Erickson  OB Hx: G2P0010 Date of Appointment: 09/21/2022  Accompanied by: Alexis Erickson to Face Time: 40 Minutes   Previous Testing Completed: Alexis Erickson previously completed carrier screening. She screened to not be a carrier for Cystic Fibrosis (CF), Spinal Muscular Atrophy (SMA), alpha thalassemia, and beta hemoglobinopathies. A negative result on carrier screening reduces the likelihood of being a carrier, however, does not entirely rule out the possibility.   Medical History:  Reports she takes prenatal vitamins. Denies personal history of diabetes, high blood pressure, thyroid conditions, and seizures. Denies bleeding, infections, and fevers in this pregnancy. Denies using tobacco, alcohol, or street drugs in this pregnancy.   Family History: A pedigree was created and scanned into Epic under the Media tab. Maternal ethnicity reported as BResearch scientist (physical sciences)and paternal ethnicity reported as BResearch scientist (physical sciences) Denies Ashkenazi Jewish ancestry. Family history not remarkable for consanguinity, individuals with birth defects, intellectual disability, autism spectrum disorder, or multiple spontaneous abortions.     Genetic Counseling:   Abnormal Non-Invasive Prenatal Screen. Alexis Erickson previously completed cell-free DNA screening (cfDNA) in this pregnancy. We reviewed that this screen analyzes cell-free DNA originating from the placenta that is found in the maternal blood circulation during pregnancy. This test can provide information regarding the presence or absence of extra  fetal DNA for chromosomes 13, 18, and 21 as well as the sex chromosomes. The result is low risk for Down Erickson, Trisomy 125 Trisomy 145 and [name]. 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 Of note, 47,XYY Erickson is different from Alexis Erickson which is caused by an extra X chromosome rather than an extra Y chromosome (47,XXY).   We discussed that 47,XYY Erickson is caused by the presence of an extra copy of the Y chromosome in males. As a result of the extra Y chromosome, affected individuals have a total of 47 chromosomes rather than the usual 457 Most cases of 47,XYY Erickson are not inherited, instead occurring as a random event during the formation of sperm cells.   We reviewed that symptoms of 47,XYY Erickson can vary greatly among affected individuals. Many individuals have no obvious signs of the condition and may never be diagnosed, while others have mild symptoms. Common features of 47,XYY Erickson include tall stature, potential learning difficulties in reading and writing, possible speech delays, and an increased vulnerability to ADHD and autism. Males with this condition may have an increased risk of asthma and epilepsy, although most males with 47,XYY syndrome do not experience these health problems. Other problems may include difficulties with coordination, hand tremors, hypotonia, and scoliosis. Males with 47,XYY Erickson often have normal sexual development. Affected individuals may have enlarged testes and slightly delayed puberty. Affected males are also usually able to father their own children who are chromosomally normal.    Behavioral difficulties such as impulsivity have also been noted in some affected individuals. We discussed that in the past, there were many misconceptions about 47,XYY Erickson, including that affected individuals are overly aggressive, lacking in empathy, and are more likely to engage in  criminal activity.  More recent studies suggest that although individuals with 47,XYY Erickson have an increased risk for learning difficulties and behavioral problems, they are not overly aggressive nor are they at increased risk for serious mental illness. Overall, most individuals with 47,XYY Erickson lead full, healthy, and normal lives.  We discussed that Alexis Erickson has the option of continuing the monitor the pregnancy via routine ultrasounds. However, pregnancies with 47,XYY Erickson would probably not demonstrate any ultrasound abnormalities. Thus, a normal-appearing ultrasound would not rule out the possibility of the baby being affected. If Alexis Erickson opted to monitor with only ultrasound examination, we discussed that Alexis Erickson baby should have an evaluation by pediatric genetics after birth to discuss postnatal genetic testing. We also discussed the option of amniocentesis for prenatal diagnosis. Possible procedural difficulties and complications that can arise include maternal infection, cramping, bleeding, fluid leakage, and/or pregnancy loss. The risk for pregnancy loss with an amniocentesis is 1/500. Genetic testing that could be ordered on an amniocentesis sample includes a fetal karyotype, fetal microarray, and testing for specific syndromes. After hearing the above information, Alexis Erickson declined amniocentesis for prenatal diagnosis and opted to continue with ultrasounds only.     Patient Plan:  Proceed with: Alexis Erickson opted to monitor the current pregnancy with only ultrasound examination, we discussed that Alexis Erickson baby should have an evaluation by pediatric genetics after birth to discuss postnatal genetic testing. Informed consent was obtained. All questions were answered.  Declined: Amniocentesis   Thank you for sharing in the care of Alexis Erickson with [place].  Please do not hesitate to contact us if you have any questions.  Staci Righter, MS, [facility]

## 2022-09-22 ENCOUNTER — Telehealth: Payer: Self-pay | Admitting: Genetics

## 2022-09-22 NOTE — Telephone Encounter (Signed)
Alexis Erickson called genetic counseling to clarify family history information. I called Alexis Erickson back and left her a voicemail to let her know I received the updated family history information.

## 2022-09-26 ENCOUNTER — Other Ambulatory Visit: Payer: Self-pay

## 2022-09-26 ENCOUNTER — Encounter: Payer: Self-pay | Admitting: Family Medicine

## 2022-09-26 ENCOUNTER — Ambulatory Visit (INDEPENDENT_AMBULATORY_CARE_PROVIDER_SITE_OTHER): Payer: Medicaid Other | Admitting: Family Medicine

## 2022-09-26 VITALS — BP 119/79 | HR 74 | Wt 128.6 lb

## 2022-09-26 DIAGNOSIS — Z3492 Encounter for supervision of normal pregnancy, unspecified, second trimester: Secondary | ICD-10-CM | POA: Diagnosis not present

## 2022-09-26 DIAGNOSIS — Z3A17 17 weeks gestation of pregnancy: Secondary | ICD-10-CM

## 2022-09-26 DIAGNOSIS — Z6791 Unspecified blood type, Rh negative: Secondary | ICD-10-CM

## 2022-09-26 DIAGNOSIS — O26899 Other specified pregnancy related conditions, unspecified trimester: Secondary | ICD-10-CM

## 2022-09-26 DIAGNOSIS — R898 Other abnormal findings in specimens from other organs, systems and tissues: Secondary | ICD-10-CM

## 2022-09-26 DIAGNOSIS — O26892 Other specified pregnancy related conditions, second trimester: Secondary | ICD-10-CM

## 2022-09-26 NOTE — Progress Notes (Signed)
   Subjective:  Alexis Erickson is a 27 y.o. G2P0010 at 39w3dbeing seen today for ongoing prenatal care.  She is currently monitored for the following issues for this low-risk pregnancy and has Supervision of low-risk pregnancy; Rh negative state in antepartum period; HPV (human papilloma virus) infection; and Abnormal genetic test on their problem list.  Patient reports no complaints.  Contractions: Not present. Vag. Bleeding: None.  Movement: Absent. Denies leaking of fluid.   The following portions of the patient's history were reviewed and updated as appropriate: allergies, current medications, past family history, past medical history, past social history, past surgical history and problem list. Problem list updated.  Objective:   Vitals:   09/26/22 1539  BP: 119/79  Pulse: 74  Weight: 128 lb 9.6 oz (58.3 kg)    Fetal Status: Fetal Heart Rate (bpm): 140   Movement: Absent     General:  Alert, oriented and cooperative. Patient is in no acute distress.  Skin: Skin is warm and dry. No rash noted.   Cardiovascular: Normal heart rate noted  Respiratory: Normal respiratory effort, no problems with respiration noted  Abdomen: Soft, gravid, appropriate for gestational age. Pain/Pressure: Absent     Pelvic: Vag. Bleeding: None     Cervical exam deferred        Extremities: Normal range of motion.  Edema: None  Mental Status: Normal mood and affect. Normal behavior. Normal judgment and thought content.   Urinalysis:      Assessment and Plan:  Pregnancy: G2P0010 at 135w3d1. Encounter for supervision of low-risk pregnancy in second trimester BP and FHR normal AFP today  2. Rh negative state in antepartum period Rhogam at 28 wks Reviewed need for rhogam within short period if she has any bleeding  3. 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 Please refer to After Visit Summary for other counseling recommendations.  Return in 4 weeks (on 10/24/2022) for Dyad patient, ob visit.   EcClarnce FlockMD

## 2022-09-26 NOTE — Patient Instructions (Signed)

## 2022-09-28 LAB — AFP, SERUM, OPEN SPINA BIFIDA
AFP MoM: 1.44
AFP Value: 70.1 ng/mL
Gest. Age on Collection Date: 17.3 weeks
Maternal Age At EDD: 24.9 yr
OSBR Risk 1 IN: 6441
Test Results:: NEGATIVE
Weight: 129 [lb_av]

## 2022-09-29 ENCOUNTER — Encounter: Payer: Medicaid Other | Admitting: Family Medicine

## 2022-10-13 ENCOUNTER — Ambulatory Visit: Payer: Medicaid Other | Attending: Family Medicine

## 2022-10-13 ENCOUNTER — Ambulatory Visit (HOSPITAL_BASED_OUTPATIENT_CLINIC_OR_DEPARTMENT_OTHER): Payer: Medicaid Other | Admitting: Obstetrics

## 2022-10-13 ENCOUNTER — Ambulatory Visit: Payer: Medicaid Other | Admitting: *Deleted

## 2022-10-13 ENCOUNTER — Other Ambulatory Visit: Payer: Self-pay | Admitting: *Deleted

## 2022-10-13 ENCOUNTER — Encounter: Payer: Self-pay | Admitting: *Deleted

## 2022-10-13 VITALS — BP 106/62 | HR 89

## 2022-10-13 DIAGNOSIS — O360120 Maternal care for anti-D [Rh] antibodies, second trimester, not applicable or unspecified: Secondary | ICD-10-CM

## 2022-10-13 DIAGNOSIS — Z3A19 19 weeks gestation of pregnancy: Secondary | ICD-10-CM | POA: Diagnosis not present

## 2022-10-13 DIAGNOSIS — Z363 Encounter for antenatal screening for malformations: Secondary | ICD-10-CM | POA: Insufficient documentation

## 2022-10-13 DIAGNOSIS — O280 Abnormal hematological finding on antenatal screening of mother: Secondary | ICD-10-CM

## 2022-10-13 DIAGNOSIS — Z3491 Encounter for supervision of normal pregnancy, unspecified, first trimester: Secondary | ICD-10-CM | POA: Insufficient documentation

## 2022-10-13 DIAGNOSIS — Q999 Chromosomal abnormality, unspecified: Secondary | ICD-10-CM | POA: Insufficient documentation

## 2022-10-13 DIAGNOSIS — O3503X0 Maternal care for (suspected) central nervous system malformation or damage in fetus, choroid plexus cysts, not applicable or unspecified: Secondary | ICD-10-CM | POA: Diagnosis not present

## 2022-10-13 DIAGNOSIS — Q985 Karyotype 47, XYY: Secondary | ICD-10-CM | POA: Diagnosis not present

## 2022-10-13 DIAGNOSIS — O3515X0 Maternal care for (suspected) chromosomal abnormality in fetus, sex chromosome abnormality, not applicable or unspecified: Secondary | ICD-10-CM

## 2022-10-13 NOTE — Progress Notes (Signed)
MFM Note  Alexis Erickson was seen for a detailed fetal anatomy scan as her cell free DNA test indicated a high risk for a pattern suggestive of XYY chromosomes in her fetus.  She is currently at 19 weeks and 6 days.  The cell free DNA test also indicated a low risk for trisomy 21, 18, and 13.  She denies any significant past medical history and denies any problems in her current pregnancy.    She was informed that the fetal growth and amniotic fluid level were appropriate for her gestational age.   On today's exam, bilateral choroid plexus cysts were noted in the fetal brain.    She was advised that the choroid plexus cysts are most likely normal variants and will usually resolve at around 24 weeks.    The small association of bilateral choroid plexus cysts with trisomy 18 was discussed.  She was advised that as no other anomalies were noted on today's ultrasound exam, it is highly unlikely that her fetus has trisomy 58.    Due to the association of choroid plexus cysts with trisomy 18 and her abnormal cell free DNA test suggestive of a XYY pattern, she was offered and declined an amniocentesis for definitive diagnosis of fetal chromosome abnormalities and fetal sex chromosome abnormalities. She will have her baby tested after birth to determine if any chromosomal abnormalities are present.    The patient already had a consultation with our genetic counselor previously.  Due to her abnormal cell free DNA test, we will continue to follow her with growth ultrasounds throughout her pregnancy.  A follow-up exam was scheduled in 5 weeks to assess the fetal growth and for follow-up of the bilateral CP cysts noted today.    The patient and her partner stated that all of their questions were answered today.  A total of 30 minutes was spent counseling and coordinating the care for this patient.  Greater than 50% of the time was spent in direct face-to-face contact.

## 2022-10-16 ENCOUNTER — Ambulatory Visit: Payer: Medicaid Other

## 2022-10-25 ENCOUNTER — Telehealth: Payer: Self-pay | Admitting: *Deleted

## 2022-10-25 NOTE — Telephone Encounter (Signed)
Patient left a message she is having issues with feet/ ankles and is [redacted] weeks pregnant - and does not have appointment until April and it is for [place]. States she wants to get appointment or nurse call with suggestions because she is very concerned and wanted to bring to doctors attention. Per chart review is Mom Baby Combined care patient  and was last seen 09/26/22 and has [place] 11/17/22 with MFM. Discussed with Mom[facility] and will route to [facility] nurse. Staci Acosta

## 2022-10-25 NOTE — Telephone Encounter (Signed)
Call placed back to pt. Spoke with pt. Pt given schedule for next OB prenatal visits. Pt verbalized understanding.  [name], RNC

## 2022-10-26 ENCOUNTER — Other Ambulatory Visit: Payer: Self-pay

## 2022-10-26 ENCOUNTER — Encounter: Payer: Self-pay | Admitting: Family Medicine

## 2022-10-26 ENCOUNTER — Ambulatory Visit (INDEPENDENT_AMBULATORY_CARE_PROVIDER_SITE_OTHER): Payer: Medicaid Other | Admitting: Family Medicine

## 2022-10-26 VITALS — BP 108/73 | HR 112 | Wt 142.6 lb

## 2022-10-26 DIAGNOSIS — O26892 Other specified pregnancy related conditions, second trimester: Secondary | ICD-10-CM

## 2022-10-26 DIAGNOSIS — R898 Other abnormal findings in specimens from other organs, systems and tissues: Secondary | ICD-10-CM

## 2022-10-26 DIAGNOSIS — Z3A21 21 weeks gestation of pregnancy: Secondary | ICD-10-CM

## 2022-10-26 DIAGNOSIS — Z3492 Encounter for supervision of normal pregnancy, unspecified, second trimester: Secondary | ICD-10-CM

## 2022-10-26 DIAGNOSIS — Z6791 Unspecified blood type, Rh negative: Secondary | ICD-10-CM

## 2022-10-26 NOTE — Patient Instructions (Signed)

## 2022-10-26 NOTE — Progress Notes (Signed)
   Subjective:  Alexis Erickson is a 27 y.o. G2P0010 at [redacted]w[redacted]d being seen today for ongoing prenatal care.  She is currently monitored for the following issues for this low-risk pregnancy and has Supervision of low-risk pregnancy; Rh negative state in antepartum period; HPV (human papilloma virus) infection; and Abnormal genetic test on their problem list.  Patient reports no complaints.  Contractions: Not present. Vag. Bleeding: None.  Movement: Present. Denies leaking of fluid.   The following portions of the patient's history were reviewed and updated as appropriate: allergies, current medications, past family history, past medical history, past social history, past surgical history and problem list. Problem list updated.  Objective:   Vitals:   10/26/22 0845  BP: 108/73  Pulse: (!) 112  Weight: 142 lb 9.6 oz (64.7 kg)    Fetal Status: Fetal Heart Rate (bpm): 138   Movement: Present     General:  Alert, oriented and cooperative. Patient is in no acute distress.  Skin: Skin is warm and dry. No rash noted.   Cardiovascular: Normal heart rate noted  Respiratory: Normal respiratory effort, no problems with respiration noted  Abdomen: Soft, gravid, appropriate for gestational age. Pain/Pressure: Present     Pelvic: Vag. Bleeding: None     Cervical exam deferred        Extremities: Normal range of motion.  Edema: Trace (was experiencing +1 edema earlier this week))  Mental Status: Normal mood and affect. Normal behavior. Normal judgment and thought content.   Urinalysis:      Assessment and Plan:  Pregnancy: G2P0010 at [redacted]w[redacted]d  1. Encounter for supervision of low-risk pregnancy in second trimester BP and FHR normal Having lots of pelvic and low back pain, trial maternity belt, not sleeping certain ways because thought she couldn't, let her know she can sleep however she wants Given letter supporting her desire to work from home Last growth [place] with small bilateral choroid  plexus cysts, discussed likely to resolve and NIPT is reassuring against Trisomy 18   2. Rh negative state in antepartum period Rhogam at 28 wks   3. 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 Please refer to After Visit Summary for other counseling recommendations.  Return in 4 weeks (on 11/23/2022) for Dyad patient, ob visit.   Clarnce Flock, MD

## 2022-11-17 ENCOUNTER — Ambulatory Visit: Payer: Medicaid Other | Attending: Obstetrics

## 2022-11-17 ENCOUNTER — Ambulatory Visit: Payer: Medicaid Other | Admitting: *Deleted

## 2022-11-17 ENCOUNTER — Other Ambulatory Visit: Payer: Self-pay | Admitting: *Deleted

## 2022-11-17 VITALS — BP 114/64 | HR 93

## 2022-11-17 DIAGNOSIS — Z3682 Encounter for antenatal screening for nuchal translucency: Secondary | ICD-10-CM | POA: Diagnosis not present

## 2022-11-17 DIAGNOSIS — O280 Abnormal hematological finding on antenatal screening of mother: Secondary | ICD-10-CM

## 2022-11-17 DIAGNOSIS — O3515X0 Maternal care for (suspected) chromosomal abnormality in fetus, sex chromosome abnormality, not applicable or unspecified: Secondary | ICD-10-CM

## 2022-11-17 DIAGNOSIS — Z3A24 24 weeks gestation of pregnancy: Secondary | ICD-10-CM | POA: Diagnosis not present

## 2022-11-17 DIAGNOSIS — O360120 Maternal care for anti-D [Rh] antibodies, second trimester, not applicable or unspecified: Secondary | ICD-10-CM

## 2022-11-17 DIAGNOSIS — O3503X0 Maternal care for (suspected) central nervous system malformation or damage in fetus, choroid plexus cysts, not applicable or unspecified: Secondary | ICD-10-CM

## 2022-11-17 DIAGNOSIS — Q985 Karyotype 47, XYY: Secondary | ICD-10-CM

## 2022-11-22 ENCOUNTER — Other Ambulatory Visit: Payer: Self-pay

## 2022-11-22 ENCOUNTER — Ambulatory Visit (INDEPENDENT_AMBULATORY_CARE_PROVIDER_SITE_OTHER): Payer: Medicaid Other | Admitting: Family Medicine

## 2022-11-22 VITALS — BP 111/74 | HR 89 | Wt 146.8 lb

## 2022-11-22 DIAGNOSIS — O26892 Other specified pregnancy related conditions, second trimester: Secondary | ICD-10-CM

## 2022-11-22 DIAGNOSIS — Z6791 Unspecified blood type, Rh negative: Secondary | ICD-10-CM

## 2022-11-22 DIAGNOSIS — Z3A25 25 weeks gestation of pregnancy: Secondary | ICD-10-CM

## 2022-11-22 DIAGNOSIS — R898 Other abnormal findings in specimens from other organs, systems and tissues: Secondary | ICD-10-CM

## 2022-11-22 DIAGNOSIS — Z3492 Encounter for supervision of normal pregnancy, unspecified, second trimester: Secondary | ICD-10-CM

## 2022-11-22 NOTE — Progress Notes (Signed)
   PRENATAL VISIT NOTE  Subjective:  Alexis Erickson is a 27 y.o. G2P0010 at [redacted]w[redacted]d being seen today for ongoing prenatal care.  She is currently monitored for the following issues for this low-risk pregnancy and has Supervision of low-risk pregnancy; Rh negative state in antepartum period; HPV (human papilloma virus) infection; and Abnormal genetic test on their problem list.  Patient reports no complaints.  Contractions: Not present. Vag. Bleeding: None.  Movement: Present. Denies leaking of fluid.   The following portions of the patient's history were reviewed and updated as appropriate: allergies, current medications, past family history, past medical history, past social history, past surgical history and problem list.   Objective:   Vitals:   11/22/22 1606  BP: 111/74  Pulse: 89  Weight: 146 lb 12.8 oz (66.6 kg)    Fetal Status: Fetal Heart Rate (bpm): 147   Movement: Present     General:  Alert, oriented and cooperative. Patient is in no acute distress.  Skin: Skin is warm and dry. No rash noted.   Cardiovascular: Normal heart rate noted  Respiratory: Normal respiratory effort, no problems with respiration noted  Abdomen: Soft, gravid, appropriate for gestational age.  Pain/Pressure: Absent     Pelvic: Cervical exam deferred        Extremities: Normal range of motion.     Mental Status: Normal mood and affect. Normal behavior. Normal judgment and thought content.   Assessment and Plan:  Pregnancy: G2P0010 at [redacted]w[redacted]d 1. Encounter for supervision of low-risk pregnancy in second trimester UP to date Labs next visit Rhogam at next visit vs following-- per patient preference. We discussed timing of rhogam and protection with maternal-infant blood mixing Doing well Feeling good fetal movement Having some indigestion- encouraged tums vs pepcid.   2. Rh negative state in antepartum period Rhogam at next visit vs susequent, patient can decided. Her is being seen at 27wk  and 29wk so reasonable regardless. Patient tends to not like needles.   3. Abnormal genetic test XYY on NIP  Preterm labor symptoms and general obstetric precautions including but not limited to vaginal bleeding, contractions, leaking of fluid and fetal movement were reviewed in detail with the patient. Please refer to After Visit Summary for other counseling recommendations.   No follow-ups on file.  Future Appointments  Date Time Provider Department Center  12/05/2022  8:15 AM Venora Maples, MD [facility]  12/05/2022  8:20 AM [facility]  12/19/2022  3:35 PM Venora Maples, MD [facility]  01/03/2023  3:35 PM Federico Flake, MD [facility]  01/11/2023  8:30 AM [facility] NURSE [facility]  01/11/2023  8:45 AM [facility] US4 [facility]  01/17/2023  1:55 PM Federico Flake, MD [facility]  01/31/2023  1:55 PM Federico Flake, MD [facility]    Federico Flake, MD

## 2022-11-28 ENCOUNTER — Other Ambulatory Visit: Payer: Self-pay

## 2022-11-28 DIAGNOSIS — Z3493 Encounter for supervision of normal pregnancy, unspecified, third trimester: Secondary | ICD-10-CM

## 2022-12-05 ENCOUNTER — Other Ambulatory Visit: Payer: Medicaid Other

## 2022-12-05 ENCOUNTER — Other Ambulatory Visit: Payer: Self-pay

## 2022-12-05 ENCOUNTER — Encounter: Payer: Self-pay | Admitting: Family Medicine

## 2022-12-05 ENCOUNTER — Ambulatory Visit (INDEPENDENT_AMBULATORY_CARE_PROVIDER_SITE_OTHER): Payer: Medicaid Other | Admitting: Family Medicine

## 2022-12-05 VITALS — BP 120/75 | HR 102 | Wt 151.2 lb

## 2022-12-05 DIAGNOSIS — Z3493 Encounter for supervision of normal pregnancy, unspecified, third trimester: Secondary | ICD-10-CM

## 2022-12-05 DIAGNOSIS — Z3A27 27 weeks gestation of pregnancy: Secondary | ICD-10-CM | POA: Diagnosis not present

## 2022-12-05 DIAGNOSIS — R898 Other abnormal findings in specimens from other organs, systems and tissues: Secondary | ICD-10-CM

## 2022-12-05 DIAGNOSIS — Z6791 Unspecified blood type, Rh negative: Secondary | ICD-10-CM

## 2022-12-05 DIAGNOSIS — O26892 Other specified pregnancy related conditions, second trimester: Secondary | ICD-10-CM | POA: Diagnosis not present

## 2022-12-05 DIAGNOSIS — Z23 Encounter for immunization: Secondary | ICD-10-CM

## 2022-12-05 MED ORDER — RHO D IMMUNE GLOBULIN 1500 UNIT/2ML IJ SOSY: 300.0000 ug | PREFILLED_SYRINGE | Freq: Once | INTRAMUSCULAR | Status: DC

## 2022-12-05 MED ORDER — RHO D IMMUNE GLOBULIN 1500 UNITS IM SOSY: 300.0000 ug | PREFILLED_SYRINGE | Freq: Once | INTRAMUSCULAR | 0 refills | Status: DC

## 2022-12-05 NOTE — Addendum Note (Signed)
Addended by: Faythe Casa on: 12/05/2022 05:00 PM   Modules accepted: Orders

## 2022-12-05 NOTE — Patient Instructions (Signed)

## 2022-12-05 NOTE — Progress Notes (Signed)
   Subjective:  Alexis Erickson is a 27 y.o. G2P0010 at [redacted]w[redacted]d being seen today for ongoing prenatal care.  She is currently monitored for the following issues for this low-risk pregnancy and has Supervision of low-risk pregnancy; Rh negative state in antepartum period; HPV (human papilloma virus) infection; and Abnormal genetic test on their problem list.  Patient reports no complaints.  Contractions: Irritability. Vag. Bleeding: None.  Movement: Present. Denies leaking of fluid.   The following portions of the patient's history were reviewed and updated as appropriate: allergies, current medications, past family history, past medical history, past social history, past surgical history and problem list. Problem list updated.  Objective:   Vitals:   12/05/22 0827  BP: 120/75  Pulse: (!) 102  Weight: 151 lb 3.2 oz (68.6 kg)    Fetal Status: Fetal Heart Rate (bpm): 135   Movement: Present     General:  Alert, oriented and cooperative. Patient is in no acute distress.  Skin: Skin is warm and dry. No rash noted.   Cardiovascular: Normal heart rate noted  Respiratory: Normal respiratory effort, no problems with respiration noted  Abdomen: Soft, gravid, appropriate for gestational age. Pain/Pressure: Absent     Pelvic: Vag. Bleeding: None     Cervical exam deferred        Extremities: Normal range of motion.     Mental Status: Normal mood and affect. Normal behavior. Normal judgment and thought content.   Urinalysis:      Assessment and Plan:  Pregnancy: G2P0010 at [redacted]w[redacted]d  1. Encounter for supervision of low-risk pregnancy in third trimester BP and FHR normal Third trimester labs today Tdap given  2. Rh negative state in antepartum period Rhogam given  3. 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 Please refer to After Visit Summary for other counseling recommendations.  Return in 2 weeks (on 12/19/2022) for Dyad patient, ob visit.   Venora Maples, MD

## 2022-12-07 ENCOUNTER — Encounter: Payer: Self-pay | Admitting: Family Medicine

## 2022-12-07 DIAGNOSIS — Z3483 Encounter for supervision of other normal pregnancy, third trimester: Secondary | ICD-10-CM | POA: Diagnosis not present

## 2022-12-07 DIAGNOSIS — Z8632 Personal history of gestational diabetes: Secondary | ICD-10-CM | POA: Insufficient documentation

## 2022-12-07 DIAGNOSIS — D696 Thrombocytopenia, unspecified: Secondary | ICD-10-CM | POA: Insufficient documentation

## 2022-12-07 DIAGNOSIS — O99119 Other diseases of the blood and blood-forming organs and certain disorders involving the immune mechanism complicating pregnancy, unspecified trimester: Secondary | ICD-10-CM | POA: Insufficient documentation

## 2022-12-07 DIAGNOSIS — Z3482 Encounter for supervision of other normal pregnancy, second trimester: Secondary | ICD-10-CM | POA: Diagnosis not present

## 2022-12-07 DIAGNOSIS — O24419 Gestational diabetes mellitus in pregnancy, unspecified control: Secondary | ICD-10-CM | POA: Insufficient documentation

## 2022-12-07 LAB — RPR: RPR Ser Ql: NONREACTIVE

## 2022-12-07 LAB — GLUCOSE TOLERANCE, 2 HOURS W/ 1HR
Glucose, 1 hour: 122 mg/dL (ref 70–179)
Glucose, 2 hour: 77 mg/dL (ref 70–152)
Glucose, Fasting: 93 mg/dL — ABNORMAL HIGH (ref 70–91)

## 2022-12-07 LAB — CBC
Hematocrit: 33.3 % — ABNORMAL LOW (ref 34.0–46.6)
Hemoglobin: 10.8 g/dL — ABNORMAL LOW (ref 11.1–15.9)
MCH: 27.8 pg (ref 26.6–33.0)
MCHC: 32.4 g/dL (ref 31.5–35.7)
MCV: 86 fL (ref 79–97)
Platelets: 121 10*3/uL — ABNORMAL LOW (ref 150–450)
RBC: 3.88 x10E6/uL (ref 3.77–5.28)
RDW: 13.3 % (ref 11.7–15.4)
WBC: 7.8 10*3/uL (ref 3.4–10.8)

## 2022-12-07 LAB — ANTIBODY SCREEN: Antibody Screen: NEGATIVE

## 2022-12-07 LAB — HIV ANTIBODY (ROUTINE TESTING W REFLEX): HIV Screen 4th Generation wRfx: NONREACTIVE

## 2022-12-08 ENCOUNTER — Telehealth: Payer: Self-pay

## 2022-12-08 MED ORDER — ACCU-CHEK SOFTCLIX LANCETS MISC: 12 refills | Status: DC

## 2022-12-08 MED ORDER — GLUCOSE BLOOD VI STRP: ORAL_STRIP | 12 refills | Status: DC

## 2022-12-08 MED ORDER — ACCU-CHEK GUIDE W/DEVICE KIT: 1.0000 | PACK | Freq: Four times a day (QID) | 0 refills | Status: DC

## 2022-12-08 NOTE — Telephone Encounter (Signed)
-----   Message from Sue Lush, FNP sent at 12/08/2022  7:56 AM EDT -----  ----- Message ----- From: Venora Maples, MD Sent: 12/07/2022   4:25 PM EDT To: [facility] Dyad Clinical  28 wk labs show GDM, please send testing supplies and coordinate DM educator visit Also mild gestational thrombocytopenia, recheck at 36 weeks

## 2022-12-08 NOTE — Telephone Encounter (Signed)
Call placed to pt. Spoke with pt. Pt given results per Dr Crissie Reese. Pt verbalized understanding and agreeable to plan of care.  Diabetes Education apt 5/9 at 315pm Diabetes supplies Rx sent pharmacy.  Judeth Cornfield, RNC

## 2022-12-14 ENCOUNTER — Other Ambulatory Visit: Payer: Self-pay

## 2022-12-14 ENCOUNTER — Encounter: Payer: Medicaid Other | Attending: Family Medicine | Admitting: Registered"

## 2022-12-14 ENCOUNTER — Ambulatory Visit (INDEPENDENT_AMBULATORY_CARE_PROVIDER_SITE_OTHER): Payer: Medicaid Other | Admitting: Registered"

## 2022-12-14 DIAGNOSIS — O24419 Gestational diabetes mellitus in pregnancy, unspecified control: Secondary | ICD-10-CM

## 2022-12-14 DIAGNOSIS — Z713 Dietary counseling and surveillance: Secondary | ICD-10-CM | POA: Insufficient documentation

## 2022-12-14 DIAGNOSIS — Z3A28 28 weeks gestation of pregnancy: Secondary | ICD-10-CM

## 2022-12-14 NOTE — Progress Notes (Signed)
Patient was seen for Gestational Diabetes self-management on 12/14/22  Start time 1515 and End time 1617   Estimated due date: 03/05/23; [redacted]w[redacted]d  Next Ob visit 12/19/22 (mom/baby) with Alexis Erickson at [facility]  Clinical: Medications: reviewed Medical History: reviewed Labs: OGTT   Lab Results  Component Value Date   HGBA1C 5.5 09/01/2022    Dietary and Lifestyle History: Pt states she has reduced her juice intake, pt states she has been having Tropical smoothie 1x/ week.  Pt states she drinks almond milk instead of cow milk.  Physical Activity: walks dog 15 min daily, stairs -lives on 3rd floor, walks at work Stress: not assessed Sleep: 9-10 pm - 6:30 am  24 hr Recall:  First Meal: fruit sometimes with  granola bar OR Chic fil-a4 count mini and hash brown  Snack: fruit Second meal: quick fast food (working) chic-fil-a or chipotle with brown rice Snack: Third meal: steak, whole grain sometimes pasta, vegetables, juice Snack: pb crackers Beverages: water, diluted pineapple juice, liquid iv, sweet tea last night, hot ginger turmeric tea, green tea, energy boost herbal tea, sweeten with Splenda or honey  NUTRITION INTERVENTION  Nutrition education (E-1) on the following topics:   Initial Follow-up  [x]  []  Definition of Gestational Diabetes [x]  []  Why dietary management is important in controlling blood glucose [x]  []  Effects each nutrient has on blood glucose levels [x]  []  Simple carbohydrates vs complex carbohydrates []  []  Fluid intake [x]  []  Creating a balanced meal plan [x]  []  Carbohydrate counting  [x]  []  When to check blood glucose levels [x]  []  Proper blood glucose monitoring techniques [x]  []  Effect of stress and stress reduction techniques  [x]  []  Exercise effect on blood glucose levels, appropriate exercise during pregnancy [x]  []  Importance of limiting caffeine and abstaining from alcohol and smoking []  []  Medications used for blood sugar control during  pregnancy [x]  []  Hypoglycemia and rule of 15 [x]  []  Postpartum self care  Blood glucose monitor given: Accu-chek Guide Me Lot #[id number] Exp: 01-23-2024 CBG: 79 mg/dL   Patient instructed to monitor glucose levels: FBS: 60 - ? 95 mg/dL; 2 hour: ? 147 mg/dL  Patient received handouts: Nutrition Diabetes and Pregnancy Carbohydrate Counting List  Patient will be seen for follow-up as needed.

## 2022-12-19 ENCOUNTER — Encounter: Payer: Self-pay | Admitting: Obstetrics and Gynecology

## 2022-12-19 ENCOUNTER — Ambulatory Visit (INDEPENDENT_AMBULATORY_CARE_PROVIDER_SITE_OTHER): Payer: Medicaid Other | Admitting: Obstetrics and Gynecology

## 2022-12-19 ENCOUNTER — Other Ambulatory Visit: Payer: Self-pay

## 2022-12-19 VITALS — BP 115/75 | HR 112 | Wt 156.2 lb

## 2022-12-19 DIAGNOSIS — O99113 Other diseases of the blood and blood-forming organs and certain disorders involving the immune mechanism complicating pregnancy, third trimester: Secondary | ICD-10-CM

## 2022-12-19 DIAGNOSIS — O26899 Other specified pregnancy related conditions, unspecified trimester: Secondary | ICD-10-CM

## 2022-12-19 DIAGNOSIS — D696 Thrombocytopenia, unspecified: Secondary | ICD-10-CM

## 2022-12-19 DIAGNOSIS — O24410 Gestational diabetes mellitus in pregnancy, diet controlled: Secondary | ICD-10-CM

## 2022-12-19 DIAGNOSIS — O0993 Supervision of high risk pregnancy, unspecified, third trimester: Secondary | ICD-10-CM

## 2022-12-19 DIAGNOSIS — Z3A29 29 weeks gestation of pregnancy: Secondary | ICD-10-CM

## 2022-12-19 DIAGNOSIS — Z6791 Unspecified blood type, Rh negative: Secondary | ICD-10-CM

## 2022-12-19 NOTE — Progress Notes (Signed)
   Subjective:  Alexis Erickson is a 27 y.o. G2P0010 at [redacted]w[redacted]d being seen today for ongoing prenatal care.  She is currently monitored for the following issues for this high-risk pregnancy and has Supervision of low-risk pregnancy; Rh negative state in antepartum period; HPV (human papilloma virus) infection; Abnormal genetic test; Gestational thrombocytopenia (HCC); and GDM (gestational diabetes mellitus) on their problem list.  Patient reports pelvic pain. Contractions: Irritability. Vag. Bleeding: None.  Movement: Present. Denies leaking of fluid. Reports in high school fractured pelvis, reports it fully healed.   The following portions of the patient's history were reviewed and updated as appropriate: allergies, current medications, past family history, past medical history, past social history, past surgical history and problem list. Problem list updated.  Objective:   Vitals:   12/19/22 1542  BP: 115/75  Pulse: (!) 112  Weight: 156 lb 3.2 oz (70.9 kg)    Fetal Status: Fetal Heart Rate (bpm): 164   Movement: Present     General:  Alert, oriented and cooperative. Patient is in no acute distress.  Skin: Skin is warm and dry. No rash noted.   Cardiovascular: Normal heart rate noted  Respiratory: Normal respiratory effort, no problems with respiration noted  Abdomen: Soft, gravid, appropriate for gestational age. Pain/Pressure: Present     Pelvic: Vag. Bleeding: None     Cervical exam deferred        Extremities: Normal range of motion.  Edema: Trace  Mental Status: Normal mood and affect. Normal behavior. Normal judgment and thought content.   Urinalysis:      Assessment and Plan:  Pregnancy: G2P0010 at [redacted]w[redacted]d  1. Encounter for supervision of high risk pregnancy in third trimester, antepartum BP and FHR normal Feeling vigorous movement   2. Rh negative state in antepartum period Rhogam was given 4/30  3. Diet controlled gestational diabetes mellitus (GDM) in third  trimester Saw diabetes educator  5/9 Out of town not checking sugars 10-13th, didn't check as regular FS majority <95  Growth u/s scheduled 6/6  4. Benign gestational thrombocytopenia in third trimester (HCC) Last plt 121, will repeat CBC later in pregnancy   Preterm labor symptoms and general obstetric precautions including but not limited to vaginal bleeding, contractions, leaking of fluid and fetal movement were reviewed in detail with the patient. Please refer to After Visit Summary for other counseling recommendations.  Future Appointments  Date Time Provider Department Center  01/03/2023  3:35 PM Federico Flake, MD [facility]  01/11/2023  8:30 AM [facility] NURSE [facility]  01/11/2023  8:45 AM [facility] US4 [facility]  01/17/2023  1:55 PM Federico Flake, MD [facility]  01/31/2023  1:55 PM Federico Flake, MD [facility]    Sue Lush, FNP

## 2022-12-25 ENCOUNTER — Encounter: Payer: Self-pay | Admitting: Family Medicine

## 2023-01-03 ENCOUNTER — Ambulatory Visit (INDEPENDENT_AMBULATORY_CARE_PROVIDER_SITE_OTHER): Payer: Medicaid Other | Admitting: Family Medicine

## 2023-01-03 ENCOUNTER — Encounter: Payer: Self-pay | Admitting: Family Medicine

## 2023-01-03 ENCOUNTER — Other Ambulatory Visit: Payer: Self-pay

## 2023-01-03 VITALS — BP 108/72 | HR 103 | Wt 158.0 lb

## 2023-01-03 DIAGNOSIS — Z3A31 31 weeks gestation of pregnancy: Secondary | ICD-10-CM

## 2023-01-03 DIAGNOSIS — Z3493 Encounter for supervision of normal pregnancy, unspecified, third trimester: Secondary | ICD-10-CM

## 2023-01-03 DIAGNOSIS — O24410 Gestational diabetes mellitus in pregnancy, diet controlled: Secondary | ICD-10-CM

## 2023-01-03 DIAGNOSIS — O99113 Other diseases of the blood and blood-forming organs and certain disorders involving the immune mechanism complicating pregnancy, third trimester: Secondary | ICD-10-CM

## 2023-01-03 DIAGNOSIS — D696 Thrombocytopenia, unspecified: Secondary | ICD-10-CM

## 2023-01-03 DIAGNOSIS — Z6791 Unspecified blood type, Rh negative: Secondary | ICD-10-CM

## 2023-01-03 DIAGNOSIS — O26893 Other specified pregnancy related conditions, third trimester: Secondary | ICD-10-CM

## 2023-01-03 MED ORDER — CYCLOBENZAPRINE HCL 10 MG PO TABS: 10.0000 mg | ORAL_TABLET | Freq: Three times a day (TID) | ORAL | 1 refills | Status: DC | PRN

## 2023-01-03 NOTE — Progress Notes (Signed)
   PRENATAL VISIT NOTE  Subjective:  Alexis Erickson is a 27 y.o. G2P0010 at [redacted]w[redacted]d being seen today for ongoing prenatal care.  She is currently monitored for the following issues for this low-risk pregnancy and has Supervision of low-risk pregnancy; Rh negative state in antepartum period; HPV (human papilloma virus) infection; Abnormal genetic test; Gestational thrombocytopenia (HCC); and GDM (gestational diabetes mellitus) on their problem list.  Patient reports no complaints.  Contractions: Not present. Vag. Bleeding: None.  Movement: Present. Denies leaking of fluid.   The following portions of the patient's history were reviewed and updated as appropriate: allergies, current medications, past family history, past medical history, past social history, past surgical history and problem list.   Objective:   Vitals:   01/03/23 1355  BP: 108/72  Pulse: (!) 103  Weight: 158 lb (71.7 kg)    Fetal Status: Fetal Heart Rate (bpm): 130   Movement: Present     General:  Alert, oriented and cooperative. Patient is in no acute distress.  Skin: Skin is warm and dry. No rash noted.   Cardiovascular: Normal heart rate noted  Respiratory: Normal respiratory effort, no problems with respiration noted  Abdomen: Soft, gravid, appropriate for gestational age.  Pain/Pressure: Present     Pelvic: Cervical exam deferred        Extremities: Normal range of motion.     Mental Status: Normal mood and affect. Normal behavior. Normal judgment and thought content.   Assessment and Plan:  Pregnancy: G2P0010 at [redacted]w[redacted]d 1. Encounter for supervision of low-risk pregnancy in third trimester Referral to [id number] doula program FH appropriate Having sciactica pain, discussed massages, stretches, offered flexeril- patient accepted for PRN use. Discussed tylenol and patient wants to try to not use medications. We did review safe doses.  Demonstrated broad ligament massage and rocking baby.  Discussed low  back pressure points and double hip squeeze. Demonstrated method and Jalyn did them as well.   2. Diet controlled gestational diabetes mellitus (GDM) in third trimester BG are well controlled-- typically fastings in the 90s. EFW at 35-36 Encouraged her to have more frequent eating events.   3. Rh negative state in antepartum period S/p rhogam  4. Benign gestational thrombocytopenia in third trimester (HCC) PLT 121 Repeat at 36wks  Preterm labor symptoms and general obstetric precautions including but not limited to vaginal bleeding, contractions, leaking of fluid and fetal movement were reviewed in detail with the patient. Please refer to After Visit Summary for other counseling recommendations.   Return in about 2 weeks (around 01/17/2023) for Mom+Baby Combined Care.  Future Appointments  Date Time Provider Department Center  01/11/2023  8:30 AM [facility] NURSE [facility]  01/11/2023  8:45 AM [facility] US4 [facility]  01/17/2023  1:55 PM Federico Flake, MD [facility]  01/31/2023  1:55 PM Federico Flake, MD [facility]    Federico Flake, MD

## 2023-01-03 NOTE — Patient Instructions (Signed)
Alexis Erickson - Chiropractor ? ?[facility] ?[address] [place], [place] [address] ?[phone] ? ?[email or web address] ? ?

## 2023-01-03 NOTE — Progress Notes (Signed)
BS fasting range 73-101 PP range:  80-108 Did not bring log, but has picture on phone.  Judeth Cornfield, RNC

## 2023-01-11 ENCOUNTER — Other Ambulatory Visit: Payer: Self-pay | Admitting: *Deleted

## 2023-01-11 ENCOUNTER — Ambulatory Visit: Payer: Medicaid Other | Attending: Maternal & Fetal Medicine

## 2023-01-11 ENCOUNTER — Ambulatory Visit: Payer: Medicaid Other | Admitting: *Deleted

## 2023-01-11 VITALS — BP 111/65 | HR 83

## 2023-01-11 DIAGNOSIS — O3503X0 Maternal care for (suspected) central nervous system malformation or damage in fetus, choroid plexus cysts, not applicable or unspecified: Secondary | ICD-10-CM

## 2023-01-11 DIAGNOSIS — Z3689 Encounter for other specified antenatal screening: Secondary | ICD-10-CM | POA: Insufficient documentation

## 2023-01-11 DIAGNOSIS — O3515X0 Maternal care for (suspected) chromosomal abnormality in fetus, sex chromosome abnormality, not applicable or unspecified: Secondary | ICD-10-CM

## 2023-01-11 DIAGNOSIS — Z3A32 32 weeks gestation of pregnancy: Secondary | ICD-10-CM

## 2023-01-11 DIAGNOSIS — Q985 Karyotype 47, XYY: Secondary | ICD-10-CM

## 2023-01-11 DIAGNOSIS — O24410 Gestational diabetes mellitus in pregnancy, diet controlled: Secondary | ICD-10-CM

## 2023-01-11 DIAGNOSIS — O360130 Maternal care for anti-D [Rh] antibodies, third trimester, not applicable or unspecified: Secondary | ICD-10-CM

## 2023-01-17 ENCOUNTER — Ambulatory Visit (INDEPENDENT_AMBULATORY_CARE_PROVIDER_SITE_OTHER): Payer: Medicaid Other | Admitting: Family Medicine

## 2023-01-17 ENCOUNTER — Other Ambulatory Visit: Payer: Self-pay

## 2023-01-17 ENCOUNTER — Encounter: Payer: Self-pay | Admitting: Family Medicine

## 2023-01-17 VITALS — BP 112/75 | HR 120 | Wt 165.1 lb

## 2023-01-17 DIAGNOSIS — O26893 Other specified pregnancy related conditions, third trimester: Secondary | ICD-10-CM

## 2023-01-17 DIAGNOSIS — Z6791 Unspecified blood type, Rh negative: Secondary | ICD-10-CM

## 2023-01-17 DIAGNOSIS — O99113 Other diseases of the blood and blood-forming organs and certain disorders involving the immune mechanism complicating pregnancy, third trimester: Secondary | ICD-10-CM

## 2023-01-17 DIAGNOSIS — Z3A33 33 weeks gestation of pregnancy: Secondary | ICD-10-CM

## 2023-01-17 DIAGNOSIS — O24410 Gestational diabetes mellitus in pregnancy, diet controlled: Secondary | ICD-10-CM

## 2023-01-17 DIAGNOSIS — Z3493 Encounter for supervision of normal pregnancy, unspecified, third trimester: Secondary | ICD-10-CM

## 2023-01-17 DIAGNOSIS — D696 Thrombocytopenia, unspecified: Secondary | ICD-10-CM

## 2023-01-17 NOTE — Progress Notes (Signed)
   PRENATAL VISIT NOTE  Subjective:  Alexis Erickson is a 27 y.o. G2P0010 at [redacted]w[redacted]d being seen today for ongoing prenatal care.  She is currently monitored for the following issues for this high-risk pregnancy and has Supervision of low-risk pregnancy; Rh negative state in antepartum period; HPV (human papilloma virus) infection; Abnormal genetic test; Gestational thrombocytopenia (HCC); and GDM (gestational diabetes mellitus) on their problem list.  Patient reports backache and pelvic pain .  Contractions: Not present. Vag. Bleeding: None.  Movement: Present. Denies leaking of fluid.   The following portions of the patient's history were reviewed and updated as appropriate: allergies, current medications, past family history, past medical history, past social history, past surgical history and problem list.   Objective:   Vitals:   01/17/23 1358  BP: 112/75  Pulse: (!) 120  Weight: 165 lb 1.6 oz (74.9 kg)    Fetal Status: Fetal Heart Rate (bpm): 149 Fundal Height: 33 cm Movement: Present     General:  Alert, oriented and cooperative. Patient is in no acute distress.  Skin: Skin is warm and dry. No rash noted.   Cardiovascular: Normal heart rate noted  Respiratory: Normal respiratory effort, no problems with respiration noted  Abdomen: Soft, gravid, appropriate for gestational age.  Pain/Pressure: Present     Pelvic: Cervical exam deferred        Extremities: Normal range of motion.  Edema: Mild pitting, slight indentation  Mental Status: Normal mood and affect. Normal behavior. Normal judgment and thought content.   Assessment and Plan:  Pregnancy: G2P0010 at [redacted]w[redacted]d 1. Encounter for supervision of low-risk pregnancy in third trimester Up to date Reports back and pelvic pain- "miserable"-- she is hoping for induction. We discussed back pain exercises, chiropractic care. Vigorous fetal movement No concerns otherwise  2. Diet controlled gestational diabetes mellitus (GDM)  in third trimester Fastings 80-101 2hr PP 90-106 Not eating regularly EFW@32w  2299g 5#1oz 84th%, AC 93rd% Repeat US at 36 wks  3. Rh negative state in antepartum period Rhogam received at 36  4. Benign gestational thrombocytopenia in third trimester [facility]) Lab Results  Component Value Date   PLT 121 (L) 12/05/2022   PLT 155 09/01/2022   PLT 172 07/18/2022     Preterm labor symptoms and general obstetric precautions including but not limited to vaginal bleeding, contractions, leaking of fluid and fetal movement were reviewed in detail with the patient. Please refer to After Visit Summary for other counseling recommendations.   Return in about 2 weeks (around 01/31/2023) for Routine prenatal care, Mom+Baby Combined Care.  Future Appointments  Date Time Provider Department Center  01/31/2023  1:55 PM Federico Flake, MD [facility]  02/12/2023  2:15 PM [facility] NURSE [facility]  02/12/2023  2:30 PM [facility] US3 [facility]  02/12/2023  3:15 PM Federico Flake, MD [facility]  02/22/2023  3:15 PM Venora Maples, MD [facility]  03/01/2023  3:15 PM Venora Maples, MD [facility]  03/05/2023  3:15 PM [facility] US2 [facility]  03/05/2023  3:55 PM Federico Flake, MD [facility]    Federico Flake, MD

## 2023-01-21 ENCOUNTER — Other Ambulatory Visit: Payer: Self-pay

## 2023-01-21 ENCOUNTER — Encounter (HOSPITAL_BASED_OUTPATIENT_CLINIC_OR_DEPARTMENT_OTHER): Payer: Self-pay | Admitting: Advanced Practice Midwife

## 2023-01-21 ENCOUNTER — Encounter (HOSPITAL_COMMUNITY): Payer: Self-pay | Admitting: Obstetrics & Gynecology

## 2023-01-21 ENCOUNTER — Inpatient Hospital Stay (HOSPITAL_COMMUNITY)
Admission: AD | Admit: 2023-01-21 | Discharge: 2023-01-21 | Disposition: A | Discharge status: Home / Self Care | Payer: Medicaid Other | Attending: Obstetrics & Gynecology | Admitting: Obstetrics & Gynecology

## 2023-01-21 DIAGNOSIS — O479 False labor, unspecified: Secondary | ICD-10-CM

## 2023-01-21 DIAGNOSIS — M25559 Pain in unspecified hip: Secondary | ICD-10-CM | POA: Insufficient documentation

## 2023-01-21 DIAGNOSIS — Z3A33 33 weeks gestation of pregnancy: Secondary | ICD-10-CM | POA: Diagnosis not present

## 2023-01-21 DIAGNOSIS — Z8781 Personal history of (healed) traumatic fracture: Secondary | ICD-10-CM | POA: Diagnosis not present

## 2023-01-21 DIAGNOSIS — O26893 Other specified pregnancy related conditions, third trimester: Secondary | ICD-10-CM | POA: Insufficient documentation

## 2023-01-21 DIAGNOSIS — O4703 False labor before 37 completed weeks of gestation, third trimester: Secondary | ICD-10-CM | POA: Insufficient documentation

## 2023-01-21 DIAGNOSIS — R102 Pelvic and perineal pain: Secondary | ICD-10-CM | POA: Insufficient documentation

## 2023-01-21 LAB — URINALYSIS, ROUTINE W REFLEX MICROSCOPIC
Bilirubin Urine: NEGATIVE
Glucose, UA: NEGATIVE mg/dL
Hgb urine dipstick: NEGATIVE
Ketones, ur: NEGATIVE mg/dL
Nitrite: NEGATIVE
Protein, ur: NEGATIVE mg/dL
Specific Gravity, Urine: 1.019 (ref 1.005–1.030)
pH: 6 (ref 5.0–8.0)

## 2023-01-21 LAB — WET PREP, GENITAL
Clue Cells Wet Prep HPF POC: NONE SEEN
Sperm: NONE SEEN
Trich, Wet Prep: NONE SEEN
WBC, Wet Prep HPF POC: >=10 — AB (ref <10)
Yeast Wet Prep HPF POC: NONE SEEN

## 2023-01-21 MED ORDER — CYCLOBENZAPRINE HCL 5 MG PO TABS
10.0000 mg | ORAL_TABLET | Freq: Once | ORAL | Status: AC
  Administered 2023-01-21: 10 mg via ORAL
  Filled 2023-01-21: qty 2

## 2023-01-21 NOTE — MAU Note (Signed)
.  Alexis Erickson is a 27 y.o. at [redacted]w[redacted]d here in MAU reporting: pelvic, hip, groin, and lower back pain. Painful to lay down, walk, or stand. Reports having this pain for a while, has tried different things but nothing has helped. Reports having a pelvic fracture during adolescence and the pain seems to be more towards the side that the fracture was on. Denies VB or LOF. +FM. Denies any medication use.   Pain score: 8 Vitals:   01/21/23 0503  BP: 107/69  Pulse: 93  Resp: 18  Temp: 98.2 F (36.8 C)  SpO2: 100%     FHT:125 Lab orders placed from triage:  UA

## 2023-01-21 NOTE — MAU Provider Note (Signed)
Chief Complaint:  Pelvic Pain   Event Date/Time   First Provider Initiated Contact with Patient 01/21/23 0539      HPI: Alexis Erickson is a 27 y.o. G2P0010 at [redacted]w[redacted]d by LMP who presents to maternity admissions reporting LLQ, groin, and hip pain with onset last night. She reports standing yesterday, and doing housework, "nesting" but does not think she did too much.  She has hx pelvic fracture in childhood with pain on the same side as the fracture.  She denies constipation, urinary symptoms, or n/v.  She reports good fetal movement, denies LOF, vaginal bleeding or regular contractions.    HPI  Past Medical History: Past Medical History:  Diagnosis Date   Chlamydia infection 01/09/2022   HPV (human papilloma virus) infection     Past obstetric history: OB History  Gravida Para Term Preterm AB Living  2       1    SAB IAB Ectopic Multiple Live Births  1            # Outcome Date GA Lbr Len/2nd Weight Sex Delivery Anes PTL Lv  2 Current           1 SAB             Past Surgical History: Past Surgical History:  Procedure Laterality Date   DILATION AND EVACUATION N/A 01/19/2022   Procedure: DILATATION AND EVACUATION;  Surgeon: Tereso Newcomer, MD;  Location: [facility] OR;  Service: Gynecology;  Laterality: N/A;   TOOTH EXTRACTION      Family History: Family History  Problem Relation Age of Onset   Diabetes Mother    Cancer Maternal Grandmother        breast    Social History: Social History   Tobacco Use   Smoking status: Never   Smokeless tobacco: Never  Vaping Use   Vaping Use: Never used  Substance Use Topics   Alcohol use: Not Currently    Comment: occasional wine   Drug use: Not Currently    Types: Marijuana    Comment: Last use Early May 2023    Allergies: No Known Allergies  Meds:  Medications Prior to Admission  Medication Sig Dispense Refill Last Dose   Prenatal Vit-Fe Fumarate-FA (PRENATAL MULTIVITAMIN) TABS tablet Take 1 tablet by mouth  daily at 12 noon.   01/20/2023   Accu-Chek Softclix Lancets lancets Use four times daily as instructed. 100 each 12    Blood Glucose Monitoring Suppl (ACCU-CHEK GUIDE) w/Device KIT 1 Device by Does not apply route in the morning, at noon, in the evening, and at bedtime. 1 kit 0    cyclobenzaprine (FLEXERIL) 10 MG tablet Take 1 tablet (10 mg total) by mouth every 8 (eight) hours as needed for muscle spasms. (Patient not taking: Reported on 01/11/2023) 30 tablet 1    glucose blood test strip Use as instructed 100 each 12     ROS:  Review of Systems  Constitutional:  Negative for chills, fatigue and fever.  Eyes:  Negative for visual disturbance.  Respiratory:  Negative for shortness of breath.   Cardiovascular:  Negative for chest pain.  Gastrointestinal:  Positive for abdominal pain. Negative for nausea and vomiting.  Genitourinary:  Positive for pelvic pain. Negative for difficulty urinating, dysuria, flank pain, vaginal bleeding, vaginal discharge and vaginal pain.  Musculoskeletal:  Positive for back pain.  Neurological:  Negative for dizziness and headaches.  Psychiatric/Behavioral: Negative.       I have reviewed patient's  Past Medical Hx, Surgical Hx, Family Hx, Social Hx, medications and allergies.   Physical Exam  Patient Vitals for the past 24 hrs:  BP Temp Temp src Pulse Resp SpO2 Height Weight  01/21/23 0631 -- -- -- -- -- 100 % -- --  01/21/23 0626 -- -- -- -- -- 100 % -- --  01/21/23 0621 -- -- -- -- -- 100 % -- --  01/21/23 0616 -- -- -- -- -- 100 % -- --  01/21/23 0611 -- -- -- -- -- 99 % -- --  01/21/23 0607 -- -- -- -- -- 100 % -- --  01/21/23 0601 -- -- -- -- -- 99 % -- --  01/21/23 0556 -- -- -- -- -- 99 % -- --  01/21/23 0546 -- -- -- -- -- 100 % -- --  01/21/23 0542 117/76 -- -- 96 16 -- -- --  01/21/23 0541 -- -- -- -- -- 100 % -- --  01/21/23 0536 -- -- -- -- -- 100 % -- --  01/21/23 0531 -- -- -- -- -- 100 % -- --  01/21/23 0526 -- -- -- -- -- 99 % -- --   01/21/23 0525 -- -- -- -- -- 99 % -- --  01/21/23 0503 107/69 98.2 F (36.8 C) Oral 93 18 100 % 5\' 1"  (1.549 m) 76.5 kg   Constitutional: Well-developed, well-nourished female in no acute distress.  Cardiovascular: normal rate Respiratory: normal effort GI: Abd soft, non-tender, gravid appropriate for gestational age.  MS: Extremities nontender, no edema, normal ROM Neurologic: Alert and oriented x 4.  GU: Neg CVAT.  PELVIC EXAM:   Dilation: Closed Effacement (%): Thick Cervical Position: Posterior Station: Ballotable Presentation: Vertex Exam by:: Sharen Counter, CNM  FHT:  Baseline 140 , moderate variability, accelerations present, isolated variable x 1 Contractions: q 5-10 mins, mild to palpation   Labs: Results for orders placed or performed during the hospital encounter of 01/21/23 (from the past 24 hour(s))  Urinalysis, Routine w reflex microscopic -Urine, Clean Catch     Status: Abnormal   Collection Time: 01/21/23  5:26 AM  Result Value Ref Range   Color, Urine YELLOW YELLOW   APPearance HAZY (A) CLEAR   Specific Gravity, Urine 1.019 1.005 - 1.030   pH 6.0 5.0 - 8.0   Glucose, UA NEGATIVE NEGATIVE mg/dL   Hgb urine dipstick NEGATIVE NEGATIVE   Bilirubin Urine NEGATIVE NEGATIVE   Ketones, ur NEGATIVE NEGATIVE mg/dL   Protein, ur NEGATIVE NEGATIVE mg/dL   Nitrite NEGATIVE NEGATIVE   Leukocytes,Ua LARGE (A) NEGATIVE   RBC / HPF 0-5 0 - 5 RBC/hpf   WBC, UA 11-20 0 - 5 WBC/hpf   Bacteria, UA RARE (A) NONE SEEN   Squamous Epithelial / HPF 6-10 0 - 5 /HPF   Mucus PRESENT   Wet prep, genital     Status: Abnormal   Collection Time: 01/21/23  5:40 AM   Specimen: PATH Cytology Cervicovaginal Ancillary Only  Result Value Ref Range   Yeast Wet Prep HPF POC NONE SEEN NONE SEEN   Trich, Wet Prep NONE SEEN NONE SEEN   Clue Cells Wet Prep HPF POC NONE SEEN NONE SEEN   WBC, Wet Prep HPF POC >=10 (A) <10   Sperm NONE SEEN    A/Negative/-- (01/26 1013)  Imaging:   [place] MFM OB FOLLOW UP  Result Date: 01/11/2023 ----------------------------------------------------------------------  OBSTETRICS REPORT                       (  Signed Final 01/11/2023 10:23 am) ---------------------------------------------------------------------- Patient Info  ID #:       [id number]                          D.O.B.:  1996/03/19 (26 yrs)  Name:       [facility] ANNA GOLDEN              Visit Date: 01/11/2023 09:49 am              Riordan ---------------------------------------------------------------------- Performed By  Attending:        Ma Rings MD         Ref. Address:     [address]                                                             [place], [place]                                                             [address]  Performed By:     Eden Lathe BS      [facility]                    RD[facility] RVT                                 [facility]  Referred By:      Federico Flake MD ---------------------------------------------------------------------- Orders  #  Description                           Code        Ordered By  1  [place] MFM OB FOLLOW UP                   19147.82    Braxton Feathers ----------------------------------------------------------------------  #  Order #                     Accession #  Episode #  1  [id number]                   [id number]                 [id number] ---------------------------------------------------------------------- Indications  Gestational diabetes in pregnancy, diet        O24.410  controlled  Abnormal finding on antenatal screening        O28.9  (NIPS - HR for XYY)  [redacted] weeks gestation of pregnancy                Z3A.32  Rh negative state in antepartum                O36.0190  Neg Horizon/Neg AFP  Encounter for other antenatal screening        Z36.2  follow-up  ---------------------------------------------------------------------- Fetal Evaluation  Num Of Fetuses:         1  Fetal Heart Rate(bpm):  137  Cardiac Activity:       Observed  Presentation:           Cephalic  Placenta:               Anterior  P. Cord Insertion:      Previously visualized  Amniotic Fluid  AFI FV:      Within normal limits  AFI Sum(cm)     %Tile       Largest Pocket(cm)  16.82           61          7.25  RUQ(cm)                     LUQ(cm)        LLQ(cm)  7.25                        3.6            5.97 ---------------------------------------------------------------------- Biometry  BPD:      82.8  mm     G. Age:  33w 2d         69  %    CI:        76.06   %    70 - 86                                                          FL/HC:      21.5   %    19.1 - 21.3  HC:      300.9  mm     G. Age:  33w 3d         37  %    HC/AC:      0.99        0.96 - 1.17  AC:      304.4  mm     G. Age:  34w 3d         93  %    FL/BPD:     78.0   %    71 - 87  FL:       64.6  mm     G. Age:  33w 2d         63  %    FL/AC:  21.2   %    20 - 24  LV:        3.1  mm  Est. FW:    2299  gm      5 lb 1 oz     84  % ---------------------------------------------------------------------- OB History  Blood Type:   A-  Maternal Racial/Ethnic Group:   Boye (non-Hispanic)  Gravidity:    2         Term:   0        Prem:   0        SAB:   1  TOP:          0       Ectopic:  0        Living: 0 ---------------------------------------------------------------------- Gestational Age  LMP:           32w 3d        Date:  05/29/22                 EDD:   03/05/23  U/S Today:     33w 4d                                        EDD:   02/25/23  Best:          32w 3d     Det. By:  LMP  (05/29/22)          EDD:   03/05/23 ---------------------------------------------------------------------- Anatomy  Cranium:               Appears normal         LVOT:                   Appears normal  Cavum:                 Appears normal         Aortic Arch:             Previously seen  Ventricles:            Appears normal         Ductal Arch:            Previously seen  Choroid Plexus:        Appears normal         Diaphragm:              Appears normal  Cerebellum:            Appears normal         Stomach:                Appears normal, left                                                                        sided  Posterior Fossa:       Appears normal         Abdomen:                Appears normal  Nuchal Fold:  Previously seen        Abdominal Wall:         Previously seen  Face:                  Profile nl; orbits     Cord Vessels:           Previously seen                         prev visualized  Lips:                  Previously seen        Kidneys:                Appear normal  Palate:                Not well visualized    Bladder:                Appears normal  Thoracic:              Appears normal         Spine:                  Previously seen  Heart:                 Appears normal         Upper Extremities:      Previously seen                         (4CH, axis, and                         situs)  RVOT:                  Appears normal         Lower Extremities:      Previously seen  Other:  Fetus appears to be a female. Hands, feet and heels previously          visualized. Nasal bone visualized. VC, 3VV and 3VTV previously          visualized. Lenses previously visualized. ---------------------------------------------------------------------- Cervix Uterus Adnexa  Cervix  Not visualized (advanced GA >24wks)  Uterus  No abnormality visualized.  Right Ovary  Within normal limits.  Left Ovary  Within normal limits.  Cul De Sac  No free fluid seen.  Adnexa  No abnormality visualized ---------------------------------------------------------------------- Comments  This patient was seen for a follow up growth scan due to diet-  controlled gestational diabetes.  Her pregnancy has also  been complicated by a cell free DNA test indicating a high  risk for XYY  chromosomes.  She denies any other problems  since her last exam.  She was informed that the fetal growth and amniotic fluid  level appears appropriate for her gestational age.  She will return in 5 weeks for a follow-up growth scan.  Weekly fetal testing should be started should she require  insulin or metformin for treatment of gestational diabetes. ----------------------------------------------------------------------                  Ma Rings, MD Electronically Signed Final Report   01/11/2023 10:23 am ----------------------------------------------------------------------   MAU Course/MDM: Orders Placed This Encounter  Procedures   Wet prep, genital   Culture, OB Urine   Urinalysis, Routine w reflex microscopic -  Urine, Clean Catch   AMB referral to rehabilitation   Discharge patient    Meds ordered this encounter  Medications   cyclobenzaprine (FLEXERIL) tablet 10 mg     NST reviewed and reactive Cervix closed, irregular contractions, no evidence of PTL Wet prep wnl, GCC pending UA with large leukocytes, will send for culture Flexeril 10 mg PO given in MAU with significant improvement in pain Increase PO fluids, use heat, Tylenol PRN, Flexeril Rx already at pharmacy, pt to pick up and take PRN Keep scheduled [facility] appts Return to MAU as needed   Assessment: 1. Pelvic pain affecting pregnancy in third trimester, antepartum   2. Braxton Hicks contractions   3. History of fracture of pelvis   4. [redacted] weeks gestation of pregnancy     Plan: Discharge home Labor precautions and fetal kick counts  Follow-up Information     [facility] at [facility] Follow up.   Specialty: Family Medicine Why: As scheduled Contact information: [address] [place] [address] [phone]        [facility] Follow up.   Specialty: Obstetrics and Gynecology Why: As needed for signs of labor or emergencies Contact  information: [address] [id number] [place] [address] [phone]               Allergies as of 01/21/2023   No Known Allergies      Medication List     TAKE these medications    Accu-Chek Guide w/Device Kit 1 Device by Does not apply route in the morning, at noon, in the evening, and at bedtime.   Accu-Chek Softclix Lancets lancets Use four times daily as instructed.   cyclobenzaprine 10 MG tablet Commonly known as: FLEXERIL Take 1 tablet (10 mg total) by mouth every 8 (eight) hours as needed for muscle spasms.   glucose blood test strip Use as instructed   prenatal multivitamin Tabs tablet Take 1 tablet by mouth daily at 12 noon.        Sharen Counter Certified Nurse-Midwife 01/21/2023 7:05 AM

## 2023-01-21 NOTE — Discharge Instructions (Signed)
Reasons to return to MAU at [facility]:  Since you are preterm, return to MAU if:  1.  Contractions are 10 minutes apart or less and they becoming more uncomfortable or painful over time 2.  You have a large gush of fluid, or a trickle of fluid that will not stop and you have to wear a pad 3.  You have bleeding that is bright red, heavier than spotting--like menstrual bleeding (spotting can be normal in early labor or after a check of your cervix) 4.  You do not feel the baby moving like he/she normally does  

## 2023-01-22 LAB — GC/CHLAMYDIA PROBE AMP (~~LOC~~) NOT AT ARMC
Chlamydia: NEGATIVE
Comment: NEGATIVE
Comment: NORMAL
Neisseria Gonorrhea: NEGATIVE

## 2023-01-22 LAB — CULTURE, OB URINE

## 2023-01-31 ENCOUNTER — Other Ambulatory Visit: Payer: Self-pay

## 2023-01-31 ENCOUNTER — Ambulatory Visit (INDEPENDENT_AMBULATORY_CARE_PROVIDER_SITE_OTHER): Payer: Medicaid Other | Admitting: Family Medicine

## 2023-01-31 VITALS — BP 119/75 | HR 114 | Wt 172.6 lb

## 2023-01-31 DIAGNOSIS — O99113 Other diseases of the blood and blood-forming organs and certain disorders involving the immune mechanism complicating pregnancy, third trimester: Secondary | ICD-10-CM

## 2023-01-31 DIAGNOSIS — O24410 Gestational diabetes mellitus in pregnancy, diet controlled: Secondary | ICD-10-CM

## 2023-01-31 DIAGNOSIS — D696 Thrombocytopenia, unspecified: Secondary | ICD-10-CM

## 2023-01-31 DIAGNOSIS — R898 Other abnormal findings in specimens from other organs, systems and tissues: Secondary | ICD-10-CM

## 2023-01-31 DIAGNOSIS — O26893 Other specified pregnancy related conditions, third trimester: Secondary | ICD-10-CM

## 2023-01-31 DIAGNOSIS — Z6791 Unspecified blood type, Rh negative: Secondary | ICD-10-CM

## 2023-01-31 DIAGNOSIS — Z3493 Encounter for supervision of normal pregnancy, unspecified, third trimester: Secondary | ICD-10-CM

## 2023-01-31 DIAGNOSIS — Z3A35 35 weeks gestation of pregnancy: Secondary | ICD-10-CM

## 2023-01-31 NOTE — Progress Notes (Signed)
   PRENATAL VISIT NOTE  Subjective:  Alexis Erickson is a 27 y.o. G2P0010 at [redacted]w[redacted]d being seen today for ongoing prenatal care.  She is currently monitored for the following issues for this low-risk pregnancy and has Supervision of low-risk pregnancy; Rh negative state in antepartum period; HPV (human papilloma virus) infection; Abnormal genetic test; Gestational thrombocytopenia (HCC); and GDM (gestational diabetes mellitus) on their problem list.  Patient reports no complaints.  Contractions: Irritability. Vag. Bleeding: None.  Movement: Present. Denies leaking of fluid.   The following portions of the patient's history were reviewed and updated as appropriate: allergies, current medications, past family history, past medical history, past social history, past surgical history and problem list.   Objective:   Vitals:   01/31/23 1420  BP: 119/75  Pulse: (!) 114  Weight: 172 lb 9.6 oz (78.3 kg)    Fetal Status: Fetal Heart Rate (bpm): 147 Fundal Height: 35 cm Movement: Present     General:  Alert, oriented and cooperative. Patient is in no acute distress.  Skin: Skin is warm and dry. No rash noted.   Cardiovascular: Normal heart rate noted  Respiratory: Normal respiratory effort, no problems with respiration noted  Abdomen: Soft, gravid, appropriate for gestational age.  Pain/Pressure: Present     Pelvic: Cervical exam deferred        Extremities: Normal range of motion.  Edema: Mild pitting, slight indentation  Mental Status: Normal mood and affect. Normal behavior. Normal judgment and thought content.   Assessment and Plan:  Pregnancy: G2P0010 at [redacted]w[redacted]d  1. Abnormal genetic test  2. Diet controlled gestational diabetes mellitus (GDM) in third trimester Fastings 2hr PP Last growth EFW 83rd%, AC 93rd% Has 7/8 growth [place]-- results will determine IOL  3. Benign gestational thrombocytopenia in third trimester (HCC) Last 121   4. Rh negative state in antepartum  period Rhogam received  5. Encounter for supervision of low-risk pregnancy in third trimester Up to date Vigorous movement Doing well otherwise Referred previously for [facility] on 5/29 but has not heard. Gave patient every baby [place] website. Patient does want doula care  Preterm labor symptoms and general obstetric precautions including but not limited to vaginal bleeding, contractions, leaking of fluid and fetal movement were reviewed in detail with the patient. Please refer to After Visit Summary for other counseling recommendations.   Return in about 2 weeks (around 02/14/2023) for Routine prenatal care, Mom+Baby Combined Care.  Future Appointments  Date Time Provider Department Center  02/12/2023  1:15 PM Federico Flake, MD [facility]  02/12/2023  2:15 PM [facility] NURSE [facility]  02/12/2023  2:30 PM [facility] US3 [facility]  02/22/2023  3:15 PM Venora Maples, MD [facility]  03/01/2023  3:15 PM Venora Maples, MD [facility]  03/05/2023  3:15 PM [facility] US2 [facility]  03/05/2023  3:55 PM Federico Flake, MD [facility]    Federico Flake, MD

## 2023-02-07 ENCOUNTER — Other Ambulatory Visit: Payer: Self-pay

## 2023-02-07 ENCOUNTER — Ambulatory Visit
Payer: Medicaid Other | Attending: Advanced Practice Midwife | Admitting: Rehabilitative and Restorative Service Providers"

## 2023-02-07 ENCOUNTER — Encounter: Payer: Self-pay | Admitting: Rehabilitative and Restorative Service Providers"

## 2023-02-07 DIAGNOSIS — R102 Pelvic and perineal pain: Secondary | ICD-10-CM | POA: Insufficient documentation

## 2023-02-07 DIAGNOSIS — M6281 Muscle weakness (generalized): Secondary | ICD-10-CM | POA: Diagnosis not present

## 2023-02-07 DIAGNOSIS — R252 Cramp and spasm: Secondary | ICD-10-CM | POA: Insufficient documentation

## 2023-02-07 DIAGNOSIS — R2689 Other abnormalities of gait and mobility: Secondary | ICD-10-CM | POA: Diagnosis not present

## 2023-02-07 DIAGNOSIS — O26893 Other specified pregnancy related conditions, third trimester: Secondary | ICD-10-CM | POA: Diagnosis not present

## 2023-02-07 DIAGNOSIS — R293 Abnormal posture: Secondary | ICD-10-CM | POA: Diagnosis not present
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
## 2023-02-12 ENCOUNTER — Telehealth (HOSPITAL_COMMUNITY): Payer: Self-pay | Admitting: *Deleted

## 2023-02-12 ENCOUNTER — Encounter (HOSPITAL_COMMUNITY): Payer: Self-pay

## 2023-02-12 ENCOUNTER — Ambulatory Visit (INDEPENDENT_AMBULATORY_CARE_PROVIDER_SITE_OTHER): Payer: Medicaid Other | Admitting: Family Medicine

## 2023-02-12 ENCOUNTER — Encounter: Payer: Medicaid Other | Admitting: Family Medicine

## 2023-02-12 ENCOUNTER — Other Ambulatory Visit: Payer: Self-pay

## 2023-02-12 ENCOUNTER — Ambulatory Visit: Payer: Medicaid Other | Attending: Obstetrics

## 2023-02-12 ENCOUNTER — Encounter: Payer: Self-pay | Admitting: *Deleted

## 2023-02-12 ENCOUNTER — Ambulatory Visit: Payer: Medicaid Other | Admitting: *Deleted

## 2023-02-12 VITALS — BP 129/87 | HR 111 | Wt 175.2 lb

## 2023-02-12 DIAGNOSIS — O99113 Other diseases of the blood and blood-forming organs and certain disorders involving the immune mechanism complicating pregnancy, third trimester: Secondary | ICD-10-CM

## 2023-02-12 DIAGNOSIS — Z3A37 37 weeks gestation of pregnancy: Secondary | ICD-10-CM

## 2023-02-12 DIAGNOSIS — O285 Abnormal chromosomal and genetic finding on antenatal screening of mother: Secondary | ICD-10-CM

## 2023-02-12 DIAGNOSIS — Z6791 Unspecified blood type, Rh negative: Secondary | ICD-10-CM

## 2023-02-12 DIAGNOSIS — O26899 Other specified pregnancy related conditions, unspecified trimester: Secondary | ICD-10-CM

## 2023-02-12 DIAGNOSIS — Z3493 Encounter for supervision of normal pregnancy, unspecified, third trimester: Secondary | ICD-10-CM

## 2023-02-12 DIAGNOSIS — D696 Thrombocytopenia, unspecified: Secondary | ICD-10-CM

## 2023-02-12 DIAGNOSIS — O26893 Other specified pregnancy related conditions, third trimester: Secondary | ICD-10-CM

## 2023-02-12 DIAGNOSIS — O360130 Maternal care for anti-D [Rh] antibodies, third trimester, not applicable or unspecified: Secondary | ICD-10-CM

## 2023-02-12 DIAGNOSIS — O24410 Gestational diabetes mellitus in pregnancy, diet controlled: Secondary | ICD-10-CM | POA: Diagnosis not present

## 2023-02-12 NOTE — Telephone Encounter (Signed)
Preadmission screen  

## 2023-02-12 NOTE — Progress Notes (Signed)
PRENATAL VISIT NOTE  Subjective:  Alexis Erickson is a 27 y.o. G2P0010 at [redacted]w[redacted]d being seen today for ongoing prenatal care.  She is currently monitored for the following issues for this low-risk pregnancy and has Supervision of low-risk pregnancy; Rh negative state in antepartum period; HPV (human papilloma virus) infection; Abnormal genetic test; Gestational thrombocytopenia (HCC); and GDM (gestational diabetes mellitus) on their problem list.  Patient reports no complaints.  Contractions: Irritability. Vag. Bleeding: None.  Movement: Present. Denies leaking of fluid.   The following portions of the patient's history were reviewed and updated as appropriate: allergies, current medications, past family history, past medical history, past social history, past surgical history and problem list.   Objective:   Vitals:   02/12/23 1326  BP: 129/87  Pulse: (!) 111  Weight: 175 lb 3.2 oz (79.5 kg)    Fetal Status: Fetal Heart Rate (bpm): 132 Fundal Height: 37 cm Movement: Present  Presentation: Vertex  General:  Alert, oriented and cooperative. Patient is in no acute distress.  Skin: Skin is warm and dry. No rash noted.   Cardiovascular: Normal heart rate noted  Respiratory: Normal respiratory effort, no problems with respiration noted  Abdomen: Soft, gravid, appropriate for gestational age.  Pain/Pressure: Present     Pelvic: Cervical exam deferred Dilation: Fingertip Effacement (%): Thick Station: Ballotable-- infant fetal head is above pelvic rim and NOT engaged in pelvis.  Extremities: Normal range of motion.  Edema: Trace  Mental Status: Normal mood and affect. Normal behavior. Normal judgment and thought content.   Assessment and Plan:  Pregnancy: G2P0010 at [redacted]w[redacted]d  1. Encounter for supervision of low-risk pregnancy in third trimester Has [place] today Reports good movement Questions about IOL-- agrees to 7/22 Midnight IOL Has not heard from [facility] yet. Was  referred to [email or web address] but never heard from them Asking about Childbirth education, encouraged self paced online class Estimated Date of Delivery: 03/05/23   2. Diet controlled gestational diabetes mellitus (GDM) in third trimester Did not bring log and not checking sugars regularly-- does check fastings which are 70 to highest 108 but typically low 100s.  2hr PP are typically < 110 per patient report Has Growth [place] today IOL scheduled for 39 wks  3. Rh negative state in antepartum period Received Rhogam  4. Benign gestational thrombocytopenia in third trimester [facility]) Lab Results  Component Value Date   PLT 121 (L) 12/05/2022   PLT 155 09/01/2022  Recheck on LD  Term labor symptoms and general obstetric precautions including but not limited to vaginal bleeding, contractions, leaking of fluid and fetal movement were reviewed in detail with the patient. Please refer to After Visit Summary for other counseling recommendations.   Return in about 1 week (around 02/19/2023) for Mom+Baby Combined Care, Routine prenatal care.  Future Appointments  Date Time Provider Department Center  02/12/2023  2:15 PM [facility] NURSE [facility]  02/12/2023  2:30 PM [facility] US3 [facility]  02/15/2023 12:00 PM Salvadore Oxford [facility]  02/19/2023 11:15 AM Jeanmarie Hubert, PT [facility]  02/21/2023 11:00 AM Edrick Oh, PT [facility] None  02/22/2023  3:15 PM Venora Maples, MD [facility]  02/28/2023 11:45 AM Georg Ruddle, Clydie Braun, PT [facility] None  03/01/2023  3:15 PM Venora Maples, MD [facility]  03/02/2023  2:30 PM Salvadore Oxford [facility]  03/05/2023  3:15 PM [facility] US2 [facility]  03/05/2023  3:55 PM Federico Flake, MD [facility]  Federico Flake, MD

## 2023-02-13 ENCOUNTER — Encounter: Payer: Self-pay | Admitting: Family Medicine

## 2023-02-14 ENCOUNTER — Telehealth (HOSPITAL_COMMUNITY): Payer: Self-pay | Admitting: *Deleted

## 2023-02-14 ENCOUNTER — Encounter (HOSPITAL_COMMUNITY): Payer: Self-pay | Admitting: *Deleted

## 2023-02-14 ENCOUNTER — Encounter: Payer: Self-pay | Admitting: General Practice

## 2023-02-14 NOTE — Telephone Encounter (Signed)
Preadmission screen  

## 2023-02-15 ENCOUNTER — Ambulatory Visit (HOSPITAL_BASED_OUTPATIENT_CLINIC_OR_DEPARTMENT_OTHER): Payer: Medicaid Other | Admitting: Physical Therapy

## 2023-02-15 LAB — CULTURE, BETA STREP (GROUP B ONLY): Strep Gp B Culture: NEGATIVE

## 2023-02-19 ENCOUNTER — Ambulatory Visit (HOSPITAL_BASED_OUTPATIENT_CLINIC_OR_DEPARTMENT_OTHER): Payer: Medicaid Other | Admitting: Physical Therapy

## 2023-02-22 ENCOUNTER — Other Ambulatory Visit: Payer: Self-pay

## 2023-02-22 ENCOUNTER — Encounter: Payer: Self-pay | Admitting: Family Medicine

## 2023-02-22 ENCOUNTER — Ambulatory Visit (INDEPENDENT_AMBULATORY_CARE_PROVIDER_SITE_OTHER): Payer: Medicaid Other | Admitting: Family Medicine

## 2023-02-22 VITALS — BP 134/55 | HR 69 | Wt 183.6 lb

## 2023-02-22 DIAGNOSIS — O26893 Other specified pregnancy related conditions, third trimester: Secondary | ICD-10-CM

## 2023-02-22 DIAGNOSIS — O24410 Gestational diabetes mellitus in pregnancy, diet controlled: Secondary | ICD-10-CM

## 2023-02-22 DIAGNOSIS — O99113 Other diseases of the blood and blood-forming organs and certain disorders involving the immune mechanism complicating pregnancy, third trimester: Secondary | ICD-10-CM

## 2023-02-22 DIAGNOSIS — O0993 Supervision of high risk pregnancy, unspecified, third trimester: Secondary | ICD-10-CM

## 2023-02-22 DIAGNOSIS — Z3A38 38 weeks gestation of pregnancy: Secondary | ICD-10-CM

## 2023-02-22 DIAGNOSIS — R898 Other abnormal findings in specimens from other organs, systems and tissues: Secondary | ICD-10-CM

## 2023-02-22 DIAGNOSIS — Z6791 Unspecified blood type, Rh negative: Secondary | ICD-10-CM

## 2023-02-22 DIAGNOSIS — D696 Thrombocytopenia, unspecified: Secondary | ICD-10-CM

## 2023-02-22 NOTE — Progress Notes (Signed)
   Subjective:  Alexis Erickson is a 27 y.o. G2P0010 at [redacted]w[redacted]d being seen today for ongoing prenatal care.  She is currently monitored for the following issues for this high-risk pregnancy and has Supervision of high risk pregnancy, antepartum, third trimester; Rh negative state in antepartum period; HPV (human papilloma virus) infection; Abnormal genetic test; Gestational thrombocytopenia (HCC); and GDM (gestational diabetes mellitus) on their problem list.  Patient reports no complaints.  Contractions: Irritability. Vag. Bleeding: None.  Movement: Present. Denies leaking of fluid.   The following portions of the patient's history were reviewed and updated as appropriate: allergies, current medications, past family history, past medical history, past social history, past surgical history and problem list. Problem list updated.  Objective:   Vitals:   02/22/23 1545  BP: (!) 134/55  Pulse: 69  Weight: 183 lb 9.6 oz (83.3 kg)    Fetal Status: Fetal Heart Rate (bpm): 150   Movement: Present     General:  Alert, oriented and cooperative. Patient is in no acute distress.  Skin: Skin is warm and dry. No rash noted.   Cardiovascular: Normal heart rate noted  Respiratory: Normal respiratory effort, no problems with respiration noted  Abdomen: Soft, gravid, appropriate for gestational age. Pain/Pressure: Present     Pelvic: Vag. Bleeding: None     Cervical exam deferred        Extremities: Normal range of motion.     Mental Status: Normal mood and affect. Normal behavior. Normal judgment and thought content.   Urinalysis:      Assessment and Plan:  Pregnancy: G2P0010 at [redacted]w[redacted]d  1. Supervision of high risk pregnancy, antepartum, third trimester BP and FHR normal Good questions about IOL process, discussed process in detail  2. Rh negative state in antepartum period S/p rhogam on 12/05/2022  3. Benign gestational thrombocytopenia in third trimester [facility]) Lab Results  Component  Value Date   PLT 121 (L) 12/05/2022  mild  4. Diet controlled gestational diabetes mellitus (GDM) in third trimester Not checking sugars consistently, mostly controlled fastings, well controlled post prandials Last growth [place] 02/12/23, EFW 85% 3426g, AC 75%, AFI 21.7 Scheduled for IOL at 39 weeks   5. 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 Please refer to After Visit Summary for other counseling recommendations.  Return in 1 week (on 03/01/2023) for Dyad patient, ob visit.   Venora Maples, MD

## 2023-02-22 NOTE — Patient Instructions (Signed)

## 2023-02-26 ENCOUNTER — Encounter (HOSPITAL_COMMUNITY): Payer: Self-pay | Admitting: Obstetrics and Gynecology

## 2023-02-26 ENCOUNTER — Inpatient Hospital Stay (HOSPITAL_COMMUNITY): Payer: Medicaid Other | Admitting: Anesthesiology

## 2023-02-26 ENCOUNTER — Inpatient Hospital Stay (HOSPITAL_COMMUNITY): Payer: Medicaid Other

## 2023-02-26 ENCOUNTER — Inpatient Hospital Stay (HOSPITAL_COMMUNITY)
Admission: RE | Admit: 2023-02-26 | Discharge: 2023-02-28 | DRG: 806 | Disposition: A | Discharge status: Home / Self Care | Payer: Medicaid Other | Attending: Obstetrics and Gynecology | Admitting: Obstetrics and Gynecology

## 2023-02-26 ENCOUNTER — Other Ambulatory Visit: Payer: Self-pay

## 2023-02-26 DIAGNOSIS — O1404 Mild to moderate pre-eclampsia, complicating childbirth: Secondary | ICD-10-CM | POA: Diagnosis not present

## 2023-02-26 DIAGNOSIS — Z23 Encounter for immunization: Secondary | ICD-10-CM

## 2023-02-26 DIAGNOSIS — Z3A39 39 weeks gestation of pregnancy: Secondary | ICD-10-CM | POA: Diagnosis not present

## 2023-02-26 DIAGNOSIS — O99119 Other diseases of the blood and blood-forming organs and certain disorders involving the immune mechanism complicating pregnancy, unspecified trimester: Secondary | ICD-10-CM | POA: Diagnosis present

## 2023-02-26 DIAGNOSIS — O1403 Mild to moderate pre-eclampsia, third trimester: Secondary | ICD-10-CM | POA: Insufficient documentation

## 2023-02-26 DIAGNOSIS — O26893 Other specified pregnancy related conditions, third trimester: Secondary | ICD-10-CM | POA: Diagnosis present

## 2023-02-26 DIAGNOSIS — Z6791 Unspecified blood type, Rh negative: Secondary | ICD-10-CM

## 2023-02-26 DIAGNOSIS — O24420 Gestational diabetes mellitus in childbirth, diet controlled: Secondary | ICD-10-CM | POA: Diagnosis not present

## 2023-02-26 DIAGNOSIS — O26899 Other specified pregnancy related conditions, unspecified trimester: Secondary | ICD-10-CM

## 2023-02-26 DIAGNOSIS — O0993 Supervision of high risk pregnancy, unspecified, third trimester: Secondary | ICD-10-CM

## 2023-02-26 DIAGNOSIS — O9912 Other diseases of the blood and blood-forming organs and certain disorders involving the immune mechanism complicating childbirth: Secondary | ICD-10-CM | POA: Diagnosis present

## 2023-02-26 DIAGNOSIS — Z8632 Personal history of gestational diabetes: Secondary | ICD-10-CM | POA: Diagnosis present

## 2023-02-26 DIAGNOSIS — D696 Thrombocytopenia, unspecified: Secondary | ICD-10-CM | POA: Diagnosis present

## 2023-02-26 DIAGNOSIS — O24419 Gestational diabetes mellitus in pregnancy, unspecified control: Secondary | ICD-10-CM | POA: Diagnosis present

## 2023-02-26 LAB — COMPREHENSIVE METABOLIC PANEL
ALT: 13 U/L (ref 0–44)
AST: 21 U/L (ref 15–41)
Albumin: 2.8 g/dL — ABNORMAL LOW (ref 3.5–5.0)
Alkaline Phosphatase: 114 U/L (ref 38–126)
Anion gap: 11 (ref 5–15)
BUN: 9 mg/dL (ref 6–20)
CO2: 22 mmol/L (ref 22–32)
Calcium: 9.2 mg/dL (ref 8.9–10.3)
Chloride: 104 mmol/L (ref 98–111)
Creatinine, Ser: 0.82 mg/dL (ref 0.44–1.00)
GFR, Estimated: >60 mL/min (ref >60)
Glucose, Bld: 85 mg/dL (ref 70–99)
Potassium: 4.2 mmol/L (ref 3.5–5.1)
Sodium: 137 mmol/L (ref 135–145)
Total Bilirubin: 0.5 mg/dL (ref 0.3–1.2)
Total Protein: 6.7 g/dL (ref 6.5–8.1)

## 2023-02-26 LAB — GLUCOSE, CAPILLARY
Glucose-Capillary: 94 mg/dL (ref 70–99)
Glucose-Capillary: 90 mg/dL (ref 70–99)
Glucose-Capillary: 60 mg/dL — ABNORMAL LOW (ref 70–99)
Glucose-Capillary: 54 mg/dL — ABNORMAL LOW (ref 70–99)
Glucose-Capillary: 82 mg/dL (ref 70–99)
Glucose-Capillary: 81 mg/dL (ref 70–99)

## 2023-02-26 LAB — TYPE AND SCREEN
ABO/RH(D): A NEG
Antibody Screen: POSITIVE

## 2023-02-26 LAB — CBC
HCT: 31.9 % — ABNORMAL LOW (ref 36.0–46.0)
Hemoglobin: 9.8 g/dL — ABNORMAL LOW (ref 12.0–15.0)
MCH: 23.6 pg — ABNORMAL LOW (ref 26.0–34.0)
MCHC: 30.7 g/dL (ref 30.0–36.0)
MCV: 76.9 fL — ABNORMAL LOW (ref 80.0–100.0)
Platelets: 151 10*3/uL (ref 150–400)
RBC: 4.15 MIL/uL (ref 3.87–5.11)
RDW: 17.1 % — ABNORMAL HIGH (ref 11.5–15.5)
WBC: 10 10*3/uL (ref 4.0–10.5)
nRBC: 0.6 % — ABNORMAL HIGH (ref 0.0–0.2)

## 2023-02-26 LAB — PROTEIN / CREATININE RATIO, URINE
Creatinine, Urine: 29 mg/dL
Protein Creatinine Ratio: 0.72 mg/mg{Cre} — ABNORMAL HIGH (ref 0.00–0.15)
Total Protein, Urine: 21 mg/dL

## 2023-02-26 LAB — RPR: RPR Ser Ql: NONREACTIVE

## 2023-02-26 MED ORDER — LACTATED RINGERS IV SOLN: 500.0000 mL | INTRAVENOUS | Status: DC | PRN

## 2023-02-26 MED ORDER — OXYTOCIN BOLUS FROM INFUSION
333.0000 mL | Freq: Once | INTRAVENOUS | Status: AC
  Administered 2023-02-27: 333 mL via INTRAVENOUS

## 2023-02-26 MED ORDER — PHENYLEPHRINE 80 MCG/ML (10ML) SYRINGE FOR IV PUSH (FOR BLOOD PRESSURE SUPPORT)
80.0000 ug | PREFILLED_SYRINGE | INTRAVENOUS | Status: DC | PRN
  Filled 2023-02-26: qty 10

## 2023-02-26 MED ORDER — LIDOCAINE HCL (PF) 1 % IJ SOLN: INTRAMUSCULAR | Status: DC | PRN

## 2023-02-26 MED ORDER — FENTANYL CITRATE (PF) 100 MCG/2ML IJ SOLN: 50.0000 ug | INTRAMUSCULAR | Status: DC | PRN

## 2023-02-26 MED ORDER — EPHEDRINE 5 MG/ML INJ: 10.0000 mg | INTRAVENOUS | Status: DC | PRN

## 2023-02-26 MED ORDER — LIDOCAINE HCL (PF) 1 % IJ SOLN: 30.0000 mL | INTRAMUSCULAR | Status: DC | PRN

## 2023-02-26 MED ORDER — ONDANSETRON HCL 4 MG/2ML IJ SOLN: 4.0000 mg | Freq: Four times a day (QID) | INTRAMUSCULAR | Status: DC | PRN

## 2023-02-26 MED ORDER — DIPHENHYDRAMINE HCL 50 MG/ML IJ SOLN: 12.5000 mg | INTRAMUSCULAR | Status: DC | PRN

## 2023-02-26 MED ORDER — OXYCODONE-ACETAMINOPHEN 5-325 MG PO TABS: 2.0000 | ORAL_TABLET | ORAL | Status: DC | PRN

## 2023-02-26 MED ORDER — MISOPROSTOL 50MCG HALF TABLET
50.0000 ug | ORAL_TABLET | Freq: Once | ORAL | Status: DC
  Filled 2023-02-26: qty 1

## 2023-02-26 MED ORDER — MISOPROSTOL 25 MCG QUARTER TABLET
25.0000 ug | ORAL_TABLET | Freq: Once | ORAL | Status: AC
  Administered 2023-02-26: 25 ug via VAGINAL
  Filled 2023-02-26: qty 1

## 2023-02-26 MED ORDER — FENTANYL-BUPIVACAINE-NACL 0.5-0.125-0.9 MG/250ML-% EP SOLN
12.0000 mL/h | EPIDURAL | Status: DC | PRN
  Administered 2023-02-26: 12 mL/h via EPIDURAL
  Filled 2023-02-26: qty 250

## 2023-02-26 MED ORDER — SOD CITRATE-CITRIC ACID 500-334 MG/5ML PO SOLN: 30.0000 mL | ORAL | Status: DC | PRN

## 2023-02-26 MED ORDER — TERBUTALINE SULFATE 1 MG/ML IJ SOLN: 0.2500 mg | Freq: Once | INTRAMUSCULAR | Status: DC | PRN

## 2023-02-26 MED ORDER — LACTATED RINGERS IV SOLN: 500.0000 mL | Freq: Once | INTRAVENOUS | Status: DC

## 2023-02-26 MED ORDER — LIDOCAINE HCL (PF) 1 % IJ SOLN
INTRAMUSCULAR | Status: DC | PRN
  Administered 2023-02-26: 11 mL via EPIDURAL

## 2023-02-26 MED ORDER — OXYTOCIN-SODIUM CHLORIDE 30-0.9 UT/500ML-% IV SOLN
1.0000 m[IU]/min | INTRAVENOUS | Status: DC
  Administered 2023-02-26: 2 m[IU]/min via INTRAVENOUS
  Filled 2023-02-26: qty 500

## 2023-02-26 MED ORDER — OXYTOCIN-SODIUM CHLORIDE 30-0.9 UT/500ML-% IV SOLN
2.5000 [IU]/h | INTRAVENOUS | Status: DC
  Administered 2023-02-27: 2.5 [IU]/h via INTRAVENOUS

## 2023-02-26 MED ORDER — ACETAMINOPHEN 325 MG PO TABS: 650.0000 mg | ORAL_TABLET | ORAL | Status: DC | PRN

## 2023-02-26 MED ORDER — LACTATED RINGERS IV SOLN: INTRAVENOUS | Status: DC

## 2023-02-26 MED ORDER — OXYCODONE-ACETAMINOPHEN 5-325 MG PO TABS: 1.0000 | ORAL_TABLET | ORAL | Status: DC | PRN

## 2023-02-26 MED ORDER — PHENYLEPHRINE 80 MCG/ML (10ML) SYRINGE FOR IV PUSH (FOR BLOOD PRESSURE SUPPORT): 80.0000 ug | PREFILLED_SYRINGE | INTRAVENOUS | Status: DC | PRN

## 2023-02-26 NOTE — Inpatient Diabetes Management (Signed)
Inpatient Diabetes Program Recommendations  Diabetes Treatment Program Recommendations  ADA Standards of Care Diabetes in Pregnancy Target Glucose Ranges:  Fasting: 70 - 95 mg/dL 1 hr postprandial: Less than 140mg /dL (from first bite of meal) 2 hr postprandial: Less than 120 mg/dL (from first bite of meal)      Latest Reference Range & Units 02/26/23 02:04 02/26/23 06:10  Glucose-Capillary 70 - 99 mg/dL 94 90   Review of Glycemic Control  Diabetes history: GDM Outpatient Diabetes medications: None Current orders for Inpatient glycemic control: CBGs Q4H  Inpatient Diabetes Program Recommendations:    Insulin: May want to consider ordering Novolog 0-14 units Q4H using Diabetes Treatment for Pregnant/Postpartum Patient order set.  NOTE: Patient [redacted]W[redacted]D gestation with hx of GDM (diet controlled) admitted for IOL. CBGs 90-94 mg/dl since arrival.    Thanks, Orlando Penner, RN, MSN, CDCES Diabetes Coordinator Inpatient Diabetes Program [phone] (Team Pager from 8am to 5pm)

## 2023-02-26 NOTE — Progress Notes (Signed)
LABOR PROGRESS NOTE  Alexis Erickson is a 27 y.o. G2P0010 at [redacted]w[redacted]d  admitted for IOL for GDMA1.  Subjective: Starting to feel contractions a bit more strongly since second misoprostol dose  Objective: BP (!) 142/95   Pulse 85   Temp 98.2 F (36.8 C) (Oral)   Resp 16   Ht 5\' 1"  (1.549 m)   Wt 84.2 kg   LMP 05/29/2022 (Exact Date)   BMI 35.09 kg/m  or  Vitals:   02/26/23 0618 02/26/23 0758 02/26/23 0759 02/26/23 0853  BP: 127/79  (!) 142/95   Pulse: 88  85   Resp:  16  16  Temp:  98.2 F (36.8 C)    TempSrc:  Oral    Weight:      Height:        Physical Exam Constitutional:      General: She is not in acute distress.    Appearance: Normal appearance. She is not ill-appearing.  HENT:     Head: Atraumatic.  Eyes:     General: No scleral icterus.    Conjunctiva/sclera: Conjunctivae normal.  Pulmonary:     Effort: Pulmonary effort is normal.  Skin:    General: Skin is warm and dry.     Coloration: Skin is not jaundiced or pale.  Neurological:     Mental Status: She is alert.     Coordination: Coordination normal.  Psychiatric:        Mood and Affect: Mood normal.        Behavior: Behavior normal.     FHT Baseline: 130 bpm Variability: Good {> 6 bpm) Accelerations: Reactive Decelerations: Absent Uterine activity: regular q2-3 min Cat: I  Labs: Lab Results  Component Value Date   WBC 10.0 02/26/2023   HGB 9.8 (L) 02/26/2023   HCT 31.9 (L) 02/26/2023   MCV 76.9 (L) 02/26/2023   PLT 151 02/26/2023    Patient Active Problem List   Diagnosis Date Noted   Indication for care in labor or delivery 02/26/2023   Gestational thrombocytopenia (HCC) 12/07/2022   GDM (gestational diabetes mellitus) 12/07/2022   Abnormal genetic test 09/08/2022   HPV (human papilloma virus) infection 09/06/2022   Rh negative state in antepartum period 09/01/2022   Supervision of high risk pregnancy, antepartum, third trimester 07/12/2022    Assessment / Plan: 27  y.o. G2P0010 at [redacted]w[redacted]d here for IOL for GDMA1.  Labor: cervix more favorable on my exam and patient contracting regularly s/p two doses of misoprostol, AROM for large amount of clear fluid. Manage expectantly for now given good contraction pattern, pitocin PRN.  Fetal Wellbeing:  Cat I Pain Control:  epidural PRN GBS: neg Anticipated MOD:  NSVD  GDMA1: normal blood sugars so far  Rh neg: rhogam eval pp  Elevated BP: single mild range BP, will check PreE labs and ctm  Venora Maples, MD/MPH Attending Family Medicine Physician, [facility] for [facility], [facility]   02/26/2023, 9:20 AM

## 2023-02-26 NOTE — Anesthesia Procedure Notes (Signed)
Epidural Patient location during procedure: OB Start time: 02/26/2023 12:39 PM End time: 02/26/2023 12:56 PM  Staffing Anesthesiologist: Lowella Curb, MD Performed: anesthesiologist   Preanesthetic Checklist Completed: patient identified, IV checked, site marked, risks and benefits discussed, surgical consent, monitors and equipment checked, pre-op evaluation and timeout performed  Epidural Patient position: sitting Prep: ChloraPrep Patient monitoring: heart rate, cardiac monitor, continuous pulse ox and blood pressure Approach: midline Location: L2-L3 Injection technique: LOR saline  Needle:  Needle type: Tuohy  Needle gauge: 17 G Needle length: 9 cm Needle insertion depth: 6 cm Catheter type: closed end flexible Catheter size: 20 Guage Catheter at skin depth: 10 cm Test dose: negative  Assessment Events: blood not aspirated, injection not painful, no injection resistance, no paresthesia and negative IV test  Additional Notes Reason for block:procedure for pain

## 2023-02-26 NOTE — H&P (Addendum)
OBSTETRIC ADMISSION HISTORY AND PHYSICAL  Alexis Erickson is a 27 y.o. female G2P0010 with IUP at [redacted]w[redacted]d by LMP presenting for IOL 2/2 A1GDM. She reports +FMs, No LOF, no VB, no blurry vision, headaches, and RUQ pain.  She plans on breast feeding. She request POP for birth control. Pt notes some swelling to her BLE throughout her third trimester.  She received her prenatal care at  [facility]    Dating: By LMP --->  Estimated Date of Delivery: 03/05/23  Sono:    @[redacted]w[redacted]d , CWD, normal anatomy, cephalic presentation, anterior placenta, 3426g,  85% EFW   Prenatal History/Complications: pap 08/2022 - high risk HPV, NILM, A1GDM, Rh-, benign gestational thrombocytopenia  Past Medical History: Past Medical History:  Diagnosis Date   Chlamydia infection 01/09/2022   Gestational diabetes    HPV (human papilloma virus) infection     Past Surgical History: Past Surgical History:  Procedure Laterality Date   DILATION AND EVACUATION N/A 01/19/2022   Procedure: DILATATION AND EVACUATION;  Surgeon: Tereso Newcomer, MD;  Location: [facility] OR;  Service: Gynecology;  Laterality: N/A;   TOOTH EXTRACTION      Obstetrical History: OB History     Gravida  2   Para      Term      Preterm      AB  1   Living         SAB  1   IAB      Ectopic      Multiple      Live Births              Social History Social History   Socioeconomic History   Marital status: Single    Spouse name: Not on file   Number of children: Not on file   Years of education: Not on file   Highest education level: Not on file  Occupational History   Not on file  Tobacco Use   Smoking status: Never   Smokeless tobacco: Never  Vaping Use   Vaping status: Never Used  Substance and Sexual Activity   Alcohol use: Not Currently    Comment: occasional wine   Drug use: Not Currently    Types: Marijuana    Comment: Last use Early May 2023   Sexual activity: Not Currently    Birth control/protection:  None    Comment: [redacted] wks gestation per patient  Other Topics Concern   Not on file  Social History Narrative   Not on file   Social Determinants of Health   Financial Resource Strain: Not on file  Food Insecurity: No Food Insecurity (02/26/2023)   Hunger Vital Sign    Worried About Running Out of Food in the Last Year: Never true    Ran Out of Food in the Last Year: Never true  Transportation Needs: No Transportation Needs (02/26/2023)   PRAPARE - Administrator, Civil Service (Medical): No    Lack of Transportation (Non-Medical): No  Physical Activity: Not on file  Stress: Not on file  Social Connections: Not on file    Family History: Family History  Problem Relation Age of Onset   Diabetes Mother    Cancer Maternal Grandmother        breast    Allergies: No Known Allergies  Medications Prior to Admission  Medication Sig Dispense Refill Last Dose   Accu-Chek Softclix Lancets lancets Use four times daily as instructed. 100 each 12  Blood Glucose Monitoring Suppl (ACCU-CHEK GUIDE) w/Device KIT 1 Device by Does not apply route in the morning, at noon, in the evening, and at bedtime. 1 kit 0    cyclobenzaprine (FLEXERIL) 10 MG tablet Take 1 tablet (10 mg total) by mouth every 8 (eight) hours as needed for muscle spasms. (Patient not taking: Reported on 01/11/2023) 30 tablet 1    glucose blood test strip Use as instructed 100 each 12    Prenatal Vit-Fe Fumarate-FA (PRENATAL MULTIVITAMIN) TABS tablet Take 1 tablet by mouth daily at 12 noon.        Review of Systems   All systems reviewed and negative except as stated in HPI  Temperature 97.7 F (36.5 C), temperature source Oral, height 5\' 1"  (1.549 m), weight 84.2 kg, last menstrual period 05/29/2022, unknown if currently breastfeeding. General appearance: alert, cooperative, and no distress Extremities: Homans sign is negative, no sign of DVT, mild nonpitting edema to BL ankles and feet Presentation:  cephalic Fetal monitoringBaseline: 130 bpm, Variability: Good {> 6 bpm), Accelerations: Reactive, and Decelerations: Absent Uterine activityirregular Dilation: 3 Effacement (%): Thick Station: -3 Exam by:: mercado ortiz   Prenatal labs: ABO, Rh: A/Negative/-- (01/26 1013) Antibody: Negative (04/30 0837) Rubella: 16.30 (01/26 1013) RPR: Non Reactive (04/30 0837)  HBsAg: Negative (01/26 1013)  HIV: Non Reactive (04/30 0837)  GBS: Negative/-- (07/08 0430)  1 hr Glucola early normal, third trimester abnormal fasting Genetic screening  NIPS - XYY, AFP normal, horizon negative Anatomy US normal  Prenatal Transfer Tool  Maternal Diabetes: Yes:  Diabetes Type:  Diet controlled Genetic Screening: Abnormal:  Results: Other: XYY Maternal Ultrasounds/Referrals: Normal Fetal Ultrasounds or other Referrals:  None Maternal Substance Abuse:  No Significant Maternal Medications:  None Significant Maternal Lab Results:  Group B Strep negative and Rh negative Number of Prenatal Visits:greater than 3 verified prenatal visits Other Comments:  None  Results for orders placed or performed during the hospital encounter of 02/26/23 (from the past 24 hour(s))  Glucose, capillary   Collection Time: 02/26/23  2:04 AM  Result Value Ref Range   Glucose-Capillary 94 70 - 99 mg/dL    Patient Active Problem List   Diagnosis Date Noted   Indication for care in labor or delivery 02/26/2023   Gestational thrombocytopenia (HCC) 12/07/2022   GDM (gestational diabetes mellitus) 12/07/2022   Abnormal genetic test 09/08/2022   HPV (human papilloma virus) infection 09/06/2022   Rh negative state in antepartum period 09/01/2022   Supervision of high risk pregnancy, antepartum, third trimester 07/12/2022    Assessment/Plan:  Alexis Erickson is a 27 y.o. G2P0010 at [redacted]w[redacted]d here for IOL A1GDM  #Labor:Pt initial CVE 3/thick/-3. Cytotec 25 [facility]  #Pain: Per pt request #FWB: Cat 1 #ID:  Gbs neg #MOF:  breast #MOC: pop #Circ:  yes # A1GDM: CBG every 4 hours. # Rh neg: Received RhoGAM antepartum.  Rh workup postpartum. # Gestational thrombocytopenia: Platelets 151 on admission.  CTM  [employer], DO  02/26/2023, 2:20 AM ____ GME ATTESTATION:  Evaluation and management procedures were performed by the [facility] Medicine Resident under my supervision. I was immediately available for direct supervision, assistance and direction throughout this encounter.  I also confirm that I have verified the information documented in the resident's note, and that I have also personally reperformed the pertinent components of the physical exam and all of the medical decision making activities.  I have also made any necessary editorial changes.  Myrtie Hawk, DO OB Fellow, Faculty  [facility] for [facility] 02/26/2023 3:12 AM

## 2023-02-26 NOTE — Progress Notes (Signed)
LABOR PROGRESS NOTE  Alexis Erickson is a 27 y.o. G2P0010 at [redacted]w[redacted]d  admitted for IOL for GDMA1.  Subjective: Starting to feel contractions a bit more strongly since second misoprostol dose  Objective: BP (!) 122/101   Pulse (!) 117   Temp 98.3 F (36.8 C) (Oral)   Resp 18   Ht 5\' 1"  (1.549 m)   Wt 84.2 kg   LMP 05/29/2022 (Exact Date)   SpO2 100%   BMI 35.09 kg/m  or  Vitals:   02/26/23 1431 02/26/23 1446 02/26/23 1501 02/26/23 1531  BP: 135/84  (!) 122/101   Pulse: (!) 103  (!) 117   Resp: 16  16 18   Temp:      TempSrc:      SpO2: 100% 100%    Weight:      Height:        Physical Exam Constitutional:      General: She is not in acute distress.    Appearance: Normal appearance. She is not ill-appearing.  HENT:     Head: Atraumatic.  Eyes:     General: No scleral icterus.    Conjunctiva/sclera: Conjunctivae normal.  Pulmonary:     Effort: Pulmonary effort is normal.  Skin:    General: Skin is warm and dry.     Coloration: Skin is not jaundiced or pale.  Neurological:     Mental Status: She is alert.     Coordination: Coordination normal.  Psychiatric:        Mood and Affect: Mood normal.        Behavior: Behavior normal.     FHT Baseline: 125 bpm Variability: Good {> 6 bpm) Accelerations: Reactive Decelerations: Early Uterine activity: irregular Cat: I  Labs: Lab Results  Component Value Date   WBC 10.0 02/26/2023   HGB 9.8 (L) 02/26/2023   HCT 31.9 (L) 02/26/2023   MCV 76.9 (L) 02/26/2023   PLT 151 02/26/2023    Patient Active Problem List   Diagnosis Date Noted   Indication for care in labor or delivery 02/26/2023   Gestational thrombocytopenia (HCC) 12/07/2022   GDM (gestational diabetes mellitus) 12/07/2022   Abnormal genetic test 09/08/2022   HPV (human papilloma virus) infection 09/06/2022   Rh negative state in antepartum period 09/01/2022   Supervision of high risk pregnancy, antepartum, third trimester 07/12/2022     Assessment / Plan: 27 y.o. G2P0010 at [redacted]w[redacted]d here for IOL for GDMA1.  Labor: s/p AROM and epidural placement but contractions have spaced out and contraction pattern somewhat dysfunctional. Will start pitocin and engage in position changes to help rotate and descend baby.   Fetal Wellbeing:  Cat I Pain Control:  epidural PRN GBS: neg Anticipated MOD:  NSVD  GDMA1: normal blood sugars so far  Rh neg: rhogam eval pp  Mild PreE: ongoing mild range Bp's with proteinuria, ctm for symptoms  Venora Maples, MD/MPH Attending Family Medicine Physician, [facility] for [facility], [facility]   02/26/2023, 3:35 PM

## 2023-02-26 NOTE — Anesthesia Preprocedure Evaluation (Signed)
Anesthesia Evaluation  Patient identified by MRN, date of birth, ID band Patient awake    Reviewed: Allergy & Precautions, H&P , NPO status , Patient's Chart, lab work & pertinent test results  Airway Mallampati: II  TM Distance: >3 FB Neck ROM: Full    Dental no notable dental hx.    Pulmonary neg pulmonary ROS   Pulmonary exam normal breath sounds clear to auscultation       Cardiovascular negative cardio ROS Normal cardiovascular exam Rhythm:Regular Rate:Normal     Neuro/Psych negative neurological ROS  negative psych ROS   GI/Hepatic negative GI ROS, Neg liver ROS,,,  Endo/Other  negative endocrine ROSdiabetes    Renal/GU negative Renal ROS  negative genitourinary   Musculoskeletal negative musculoskeletal ROS (+)    Abdominal  (+) + obese  Peds negative pediatric ROS (+)  Hematology negative hematology ROS (+)   Anesthesia Other Findings   Reproductive/Obstetrics (+) Pregnancy                             Anesthesia Physical Anesthesia Plan  ASA: 2  Anesthesia Plan: Epidural   Post-op Pain Management:    Induction:   PONV Risk Score and Plan:   Airway Management Planned:   Additional Equipment:   Intra-op Plan:   Post-operative Plan:   Informed Consent:   Plan Discussed with:   Anesthesia Plan Comments:        Anesthesia Quick Evaluation

## 2023-02-26 NOTE — Progress Notes (Signed)
VSS>  comfortable w/epidural.  Intermittent pressure w/ctx. FHR Cat 1.  Ctx q 2-4 minutes.  Cx 9/100/-1/0.  IUPC placed.  MVUs ~ 250-270, but pattern q 1-4 minutes. Will utilize position changes/peanut ball to hopefully aid in descent.

## 2023-02-26 NOTE — Progress Notes (Signed)
Alexis Erickson is a 27 y.o. G2P0010 at [redacted]w[redacted]d by LMP admitted for induction of labor due to Gestational diabetes diet controlled .  Subjective: Introductions exchanged   Objective: BP 118/76   Pulse (!) 117   Temp 98.2 F (36.8 C) (Oral)   Resp 16   Ht 5\' 1"  (1.549 m)   Wt 84.2 kg   LMP 05/29/2022 (Exact Date)   SpO2 100%   BMI 35.09 kg/m  No intake/output data recorded. No intake/output data recorded.  FHT:  FHR: 145 bpm, variability: moderate,  accelerations:  Present,  decelerations:  Present early decels with contractions  UC:   regular, every 2-5 minutes SVE:   Dilation: 8 Effacement (%): 80 Station: -1 Exam by:: Raliegh Ip RN  Labs: Lab Results  Component Value Date   WBC 10.0 02/26/2023   HGB 9.8 (L) 02/26/2023   HCT 31.9 (L) 02/26/2023   MCV 76.9 (L) 02/26/2023   PLT 151 02/26/2023   CBG (last 3)  Recent Labs    02/26/23 1200 02/26/23 1624 02/26/23 1652  GLUCAP 60* 54* 82     Assessment / Plan: Induction of labor due to gestational diabetes,  progressing well on pitocin, s/p AROM  Labor: Progressing normally. Continue to titrate up 2x as needed.  Fetal Wellbeing:  Category II Pain Control:  Epidural I/D:   GBS negative  Anticipated MOD:  NSVD  Alexis Erickson, CNM 02/26/2023, 6:52 PM

## 2023-02-27 ENCOUNTER — Encounter (HOSPITAL_COMMUNITY): Payer: Self-pay | Admitting: Obstetrics and Gynecology

## 2023-02-27 DIAGNOSIS — Z3A39 39 weeks gestation of pregnancy: Secondary | ICD-10-CM

## 2023-02-27 DIAGNOSIS — O24420 Gestational diabetes mellitus in childbirth, diet controlled: Secondary | ICD-10-CM

## 2023-02-27 DIAGNOSIS — O1404 Mild to moderate pre-eclampsia, complicating childbirth: Secondary | ICD-10-CM

## 2023-02-27 DIAGNOSIS — O9912 Other diseases of the blood and blood-forming organs and certain disorders involving the immune mechanism complicating childbirth: Secondary | ICD-10-CM

## 2023-02-27 DIAGNOSIS — O1403 Mild to moderate pre-eclampsia, third trimester: Secondary | ICD-10-CM | POA: Insufficient documentation

## 2023-02-27 LAB — CBC WITH DIFFERENTIAL/PLATELET
Abs Immature Granulocytes: 0.28 10*3/uL — ABNORMAL HIGH (ref 0.00–0.07)
Basophils Absolute: 0.1 10*3/uL (ref 0.0–0.1)
Basophils Relative: 1 %
Eosinophils Absolute: 0 10*3/uL (ref 0.0–0.5)
Eosinophils Relative: 0 %
HCT: 31.2 % — ABNORMAL LOW (ref 36.0–46.0)
Hemoglobin: 10.1 g/dL — ABNORMAL LOW (ref 12.0–15.0)
Immature Granulocytes: 1 %
Lymphocytes Relative: 8 %
Lymphs Abs: 1.6 10*3/uL (ref 0.7–4.0)
MCH: 24.8 pg — ABNORMAL LOW (ref 26.0–34.0)
MCHC: 32.4 g/dL (ref 30.0–36.0)
MCV: 76.5 fL — ABNORMAL LOW (ref 80.0–100.0)
Monocytes Absolute: 2.2 10*3/uL — ABNORMAL HIGH (ref 0.1–1.0)
Monocytes Relative: 11 %
Neutro Abs: 16.7 10*3/uL — ABNORMAL HIGH (ref 1.7–7.7)
Neutrophils Relative %: 79 %
Platelets: 146 10*3/uL — ABNORMAL LOW (ref 150–400)
RBC: 4.08 MIL/uL (ref 3.87–5.11)
RDW: 17.3 % — ABNORMAL HIGH (ref 11.5–15.5)
WBC: 20.9 10*3/uL — ABNORMAL HIGH (ref 4.0–10.5)
nRBC: 0.1 % (ref 0.0–0.2)

## 2023-02-27 LAB — GLUCOSE, CAPILLARY: Glucose-Capillary: 94 mg/dL (ref 70–99)

## 2023-02-27 MED ORDER — MEDROXYPROGESTERONE ACETATE 150 MG/ML IM SUSP: 150.0000 mg | INTRAMUSCULAR | Status: DC | PRN

## 2023-02-27 MED ORDER — COCONUT OIL OIL: 1.0000 | TOPICAL_OIL | Status: DC | PRN

## 2023-02-27 MED ORDER — ONDANSETRON HCL 4 MG/2ML IJ SOLN: 4.0000 mg | INTRAMUSCULAR | Status: DC | PRN

## 2023-02-27 MED ORDER — NIFEDIPINE ER OSMOTIC RELEASE 30 MG PO TB24
30.0000 mg | ORAL_TABLET | Freq: Every day | ORAL | Status: DC
  Administered 2023-02-27 – 2023-02-28 (×2): 30 mg via ORAL
  Filled 2023-02-27 (×2): qty 1

## 2023-02-27 MED ORDER — MEASLES, MUMPS & RUBELLA VAC IJ SOLR
0.5000 mL | Freq: Once | INTRAMUSCULAR | Status: DC
  Filled 2023-02-27: qty 0.5

## 2023-02-27 MED ORDER — BISACODYL 10 MG RE SUPP: 10.0000 mg | Freq: Every day | RECTAL | Status: DC | PRN

## 2023-02-27 MED ORDER — SENNOSIDES-DOCUSATE SODIUM 8.6-50 MG PO TABS
2.0000 | ORAL_TABLET | ORAL | Status: DC
  Administered 2023-02-27 – 2023-02-28 (×2): 2 via ORAL
  Filled 2023-02-27 (×2): qty 2

## 2023-02-27 MED ORDER — METHYLERGONOVINE MALEATE 0.2 MG/ML IJ SOLN: 0.2000 mg | INTRAMUSCULAR | Status: DC | PRN

## 2023-02-27 MED ORDER — BENZOCAINE-MENTHOL 20-0.5 % EX AERO: 1.0000 | INHALATION_SPRAY | CUTANEOUS | Status: DC | PRN

## 2023-02-27 MED ORDER — RHO D IMMUNE GLOBULIN 1500 UNIT/2ML IJ SOSY
300.0000 ug | PREFILLED_SYRINGE | Freq: Once | INTRAMUSCULAR | Status: AC
  Administered 2023-02-28: 300 ug via INTRAVENOUS
  Filled 2023-02-27: qty 2

## 2023-02-27 MED ORDER — DIBUCAINE (PERIANAL) 1 % EX OINT: 1.0000 | TOPICAL_OINTMENT | CUTANEOUS | Status: DC | PRN

## 2023-02-27 MED ORDER — PRENATAL MULTIVITAMIN CH
1.0000 | ORAL_TABLET | Freq: Every day | ORAL | Status: DC
  Administered 2023-02-27 – 2023-02-28 (×2): 1 via ORAL
  Filled 2023-02-27 (×2): qty 1

## 2023-02-27 MED ORDER — SIMETHICONE 80 MG PO CHEW: 80.0000 mg | CHEWABLE_TABLET | ORAL | Status: DC | PRN

## 2023-02-27 MED ORDER — METHYLERGONOVINE MALEATE 0.2 MG PO TABS: 0.2000 mg | ORAL_TABLET | ORAL | Status: DC | PRN

## 2023-02-27 MED ORDER — ACETAMINOPHEN 325 MG PO TABS: 650.0000 mg | ORAL_TABLET | ORAL | Status: DC | PRN

## 2023-02-27 MED ORDER — IBUPROFEN 600 MG PO TABS
600.0000 mg | ORAL_TABLET | Freq: Four times a day (QID) | ORAL | Status: DC
  Administered 2023-02-27 – 2023-02-28 (×5): 600 mg via ORAL
  Filled 2023-02-27 (×6): qty 1

## 2023-02-27 MED ORDER — ONDANSETRON HCL 4 MG PO TABS: 4.0000 mg | ORAL_TABLET | ORAL | Status: DC | PRN

## 2023-02-27 MED ORDER — FUROSEMIDE 20 MG PO TABS
20.0000 mg | ORAL_TABLET | Freq: Two times a day (BID) | ORAL | Status: DC
  Administered 2023-02-27 – 2023-02-28 (×3): 20 mg via ORAL
  Filled 2023-02-27 (×4): qty 1

## 2023-02-27 MED ORDER — FLEET ENEMA 7-19 GM/118ML RE ENEM: 1.0000 | ENEMA | Freq: Every day | RECTAL | Status: DC | PRN

## 2023-02-27 MED ORDER — DIPHENHYDRAMINE HCL 25 MG PO CAPS: 25.0000 mg | ORAL_CAPSULE | Freq: Four times a day (QID) | ORAL | Status: DC | PRN

## 2023-02-27 MED ORDER — FERROUS SULFATE 325 (65 FE) MG PO TABS
325.0000 mg | ORAL_TABLET | ORAL | Status: DC
  Administered 2023-02-27: 325 mg via ORAL
  Filled 2023-02-27 (×2): qty 1

## 2023-02-27 MED ORDER — TETANUS-DIPHTH-ACELL PERTUSSIS 5-2.5-18.5 LF-MCG/0.5 IM SUSY
0.5000 mL | PREFILLED_SYRINGE | Freq: Once | INTRAMUSCULAR | Status: DC
  Filled 2023-02-27: qty 0.5

## 2023-02-27 MED ORDER — WITCH HAZEL-GLYCERIN EX PADS: 1.0000 | MEDICATED_PAD | CUTANEOUS | Status: DC | PRN

## 2023-02-27 NOTE — Progress Notes (Signed)
Patient Vitals for the past 4 hrs:  BP Temp Temp src Pulse  02/27/23 0001 130/83 -- -- (!) 113  02/26/23 2340 -- 98.9 F (37.2 C) Oral --  02/26/23 2331 134/87 -- -- (!) 124  02/26/23 2301 119/86 -- -- (!) 122  02/26/23 2231 (!) 126/93 -- -- (!) 110  02/26/23 2201 (!) 103/59 -- -- (!) 110  02/26/23 2131 105/66 -- -- 88  02/26/23 2101 (!) 112/52 -- -- (!) 114   Still some mild intermittent pressure.  Left thigh more edematous than right.  No erythema or warmth.  No pain, but epidural most certainly numbing to at least the knees.  Dr. Debroah Loop in to examine.  Low suspicion for DVT. Continue to monitor. FHR Cat 1. MVUs ~ 250, ctx pattern improved to q 2-3 minutes. RN preparing to so some Spinning Babies techniques.

## 2023-02-27 NOTE — Lactation Note (Signed)
This note was copied from a baby's chart. Lactation Consultation Note  Patient Name: Alexis Erickson [id number] Date: 02/27/2023 Age:25 hours  Reason for consult: Initial assessment;Primapara;1st time breastfeeding;Term;Breastfeeding assistance, endocrine disorder   P1, [redacted]w[redacted]d, GDM (diet), +DAT  Mother has asked for lactation assistance and receptive to [facility] visit. Infant alert after pediatrician exam. Baby placed at breast in football hold. Basic breastfeeding education. Baby "Alexis Erickson"  rooting and latching shallow with cheeks dimpling for a few sucks. He is gagging and has been spitting clear emesis since delivery. Baby has not latched and fed since birth. Baby was placed skin to skin with mother, reviewed sitting baby upright and back pats if spitting/ choking and calling for assistance by call bell which is beside mother. . RN in room and updated.  First blood sugar was 63, second blood sugar pending.   Encouraged to latch baby with feeding cues, place baby skin to skin if not latching, and call for assistance with breastfeeding as needed. Anticipate as baby approaches 24 hours of age, baby will feed more often (8-12 plus times in 24 hours) at breast and "cluster feed".   Mom made aware of O/P services, breastfeeding support groups, and our phone # for post-discharge questions.          Maternal Data Has patient been taught Hand Expression?: Yes Does the patient have breastfeeding experience prior to this delivery?: No  Feeding Mother's Current Feeding Choice: Breast Milk  LATCH Score Latch: Repeated attempts needed to sustain latch, nipple held in mouth throughout feeding, stimulation needed to elicit sucking reflex.  Audible Swallowing: None  Type of Nipple: Everted at rest and after stimulation  Comfort (Breast/Nipple): Soft / non-tender  Hold (Positioning): Assistance needed to correctly position infant at breast and maintain latch.  LATCH Score: 6   Lactation Tools  Discussed/Used    Interventions Interventions: Breast feeding basics reviewed;Assisted with latch;Skin to skin;Hand express;Adjust position;Support pillows;Education;LC Services brochure  Discharge    Consult Status Consult Status: Follow-up Date: 02/28/23 Follow-up type: In-patient    Christella Hartigan M 02/27/2023, 10:19 AM

## 2023-02-27 NOTE — Plan of Care (Signed)

## 2023-02-27 NOTE — Anesthesia Postprocedure Evaluation (Signed)
Anesthesia Post Note  Patient: Alexis Erickson  Procedure(s) Performed: AN AD HOC LABOR EPIDURAL     Patient location during evaluation: Mother Baby Anesthesia Type: Epidural Level of consciousness: awake and alert and oriented Pain management: satisfactory to patient Vital Signs Assessment: post-procedure vital signs reviewed and stable Respiratory status: respiratory function stable Cardiovascular status: stable Postop Assessment: no headache, no backache, epidural receding, patient able to bend at knees, no signs of nausea or vomiting, adequate PO intake and able to ambulate Anesthetic complications: no   No notable events documented.  Last Vitals:  Vitals:   02/27/23 0822 02/27/23 0930  BP: (!) 142/91 132/80  Pulse: 92 (!) 110  Resp: 18 18  Temp: 37.1 C 36.9 C  SpO2: 100% 100%    Last Pain:  Vitals:   02/27/23 0930  TempSrc: Oral  PainSc:    Pain Goal:                   Hanny Elsberry

## 2023-02-27 NOTE — Discharge Summary (Signed)
Postpartum Discharge Summary  Date of Service updated***     Patient Name: Alexis Erickson DOB: 03/13/1996 MRN: [id number]  Date of admission: 02/26/2023 Delivery date:02/27/2023 Delivering provider: Jacklyn Shell Date of discharge: 02/27/2023  Admitting diagnosis: Indication for care in labor or delivery [O75.9] Intrauterine pregnancy: [redacted]w[redacted]d     Secondary diagnosis:  Active Problems:   Supervision of high risk pregnancy, antepartum, third trimester   Rh negative state in antepartum period   Gestational thrombocytopenia (HCC)   GDM (gestational diabetes mellitus)   Indication for care in labor or delivery   Vaginal delivery   Mild preeclampsia, third trimester  Additional problems: ***    Discharge diagnosis: Term Pregnancy Delivered, Preeclampsia (mild), and GDM A1                                              Post partum procedures:{Postpartum procedures:23558} Augmentation: AROM, Pitocin, and Cytotec Complications: None  Hospital course: Induction of Labor With Vaginal Delivery   27 y.o. yo G2P0010 at [redacted]w[redacted]d was admitted to the hospital 02/26/2023 for induction of labor.  Indication for induction: A1 DM.  Patient had an labor course complicated by nothing Membrane Rupture Time/Date: 9:11 AM,02/26/2023  Delivery Method:Vaginal, Spontaneous Episiotomy: None Lacerations:  None Details of delivery can be found in separate delivery note.  Patient had a postpartum course complicated by***. Patient is discharged home 02/27/23.  Newborn Data: Birth date:02/27/2023 Birth time:5:26 AM Gender:Female Living status:Living Apgars:9 ,9  Weight:   Magnesium Sulfate received: No BMZ received: No Rhophylac:{Rhophylac received:30440032} MMR:N/A T-DaP:{Tdap:23962} Flu: {WUJ:81191} Transfusion:{Transfusion received:30440034}  Physical exam  Vitals:   02/27/23 0430 02/27/23 0530 02/27/23 0535 02/27/23 0545  BP: (!) 135/94 93/79 (!) 124/106 135/87  Pulse: (!) 120  (!) 114 (!) 109 (!) 104  Resp:      Temp:      TempSrc:      SpO2:      Weight:      Height:       General: {Exam; general:21111117} Lochia: {Desc; appropriate/inappropriate:30686::"appropriate"} Uterine Fundus: {Desc; firm/soft:30687} Incision: {Exam; incision:21111123} DVT Evaluation: {Exam; dvt:2111122} Labs: Lab Results  Component Value Date   WBC 10.0 02/26/2023   HGB 9.8 (L) 02/26/2023   HCT 31.9 (L) 02/26/2023   MCV 76.9 (L) 02/26/2023   PLT 151 02/26/2023      Latest Ref Rng & Units 02/26/2023    1:45 AM  CMP  Glucose 70 - 99 mg/dL 85   BUN 6 - 20 mg/dL 9   Creatinine 4.78 - 2.95 mg/dL 6.21   Sodium 308 - 657 mmol/L 137   Potassium 3.5 - 5.1 mmol/L 4.2   Chloride 98 - 111 mmol/L 104   CO2 22 - 32 mmol/L 22   Calcium 8.9 - 10.3 mg/dL 9.2   Total Protein 6.5 - 8.1 g/dL 6.7   Total Bilirubin 0.3 - 1.2 mg/dL 0.5   Alkaline Phos 38 - 126 U/L 114   AST 15 - 41 U/L 21   ALT 0 - 44 U/L 13    Edinburgh Score:     No data to display           After visit meds:  Allergies as of 02/27/2023   No Known Allergies   Med Rec must be completed prior to using this [facility]***        Discharge home  in stable condition Infant Feeding: {Baby feeding:23562} Infant Disposition:{CHL IP OB HOME WITH GLOVFI:43329} Discharge instruction: per After Visit Summary and Postpartum booklet. Activity: Advance as tolerated. Pelvic rest for 6 weeks.  Diet: {OB JJOA:41660630} Future Appointments:No future appointments. Follow up Visit:   Please schedule this patient for a In person postpartum visit in 4 weeks with the following provider: Any provider. Additional Postpartum F/U:BP check 1 week  Low risk pregnancy complicated by: GDM and HTN Delivery mode:  Vaginal, Spontaneous Anticipated Birth Control:  POPs   02/27/2023 Jacklyn Shell, CNM

## 2023-02-28 ENCOUNTER — Other Ambulatory Visit (HOSPITAL_COMMUNITY): Payer: Self-pay

## 2023-02-28 ENCOUNTER — Encounter: Payer: Medicaid Other | Admitting: Rehabilitative and Restorative Service Providers"

## 2023-02-28 LAB — RH IG WORKUP (INCLUDES ABO/RH)
Gestational Age(Wks): 39
Unit division: 0

## 2023-02-28 MED ORDER — ACETAMINOPHEN 325 MG PO TABS: 650.0000 mg | ORAL_TABLET | ORAL | 1 refills | Status: DC | PRN

## 2023-02-28 MED ORDER — IBUPROFEN 600 MG PO TABS
600.0000 mg | ORAL_TABLET | Freq: Four times a day (QID) | ORAL | 0 refills | Status: DC
  Filled 2023-02-28: qty 30, 8d supply, fill #0

## 2023-02-28 MED ORDER — FUROSEMIDE 20 MG PO TABS
20.0000 mg | ORAL_TABLET | Freq: Every day | ORAL | 0 refills | Status: DC
  Filled 2023-02-28: qty 4, 4d supply, fill #0

## 2023-02-28 MED ORDER — FERROUS SULFATE 325 (65 FE) MG PO TABS
325.0000 mg | ORAL_TABLET | ORAL | 3 refills | Status: DC
Start: 2023-03-01 — End: 2023-03-30
  Filled 2023-02-28: qty 100, 200d supply, fill #0

## 2023-02-28 MED ORDER — IBUPROFEN 600 MG PO TABS: 600.0000 mg | ORAL_TABLET | Freq: Four times a day (QID) | ORAL | 0 refills | Status: DC

## 2023-02-28 MED ORDER — NORETHINDRONE 0.35 MG PO TABS
1.0000 | ORAL_TABLET | Freq: Every day | ORAL | 11 refills | Status: DC
  Filled 2023-02-28: qty 28, 28d supply, fill #0

## 2023-02-28 MED ORDER — ACETAMINOPHEN 325 MG PO TABS
650.0000 mg | ORAL_TABLET | ORAL | 1 refills | Status: DC | PRN
  Filled 2023-02-28: qty 100, 9d supply, fill #0

## 2023-02-28 MED ORDER — NIFEDIPINE ER 30 MG PO TB24
30.0000 mg | ORAL_TABLET | Freq: Every day | ORAL | 1 refills | Status: DC
  Filled 2023-02-28: qty 30, 30d supply, fill #0

## 2023-02-28 NOTE — Plan of Care (Signed)
  Problem: Education: Goal: Knowledge of condition will improve 02/28/2023 1302 by Donne Hazel, LPN Outcome: Adequate for Discharge 02/28/2023 0835 by Donne Hazel, LPN Outcome: Progressing Goal: Individualized Educational Video(s) 02/28/2023 1302 by Donne Hazel, LPN Outcome: Adequate for Discharge 02/28/2023 0835 by Donne Hazel, LPN Outcome: Progressing Goal: Individualized Newborn Educational Video(s) 02/28/2023 1302 by Donne Hazel, LPN Outcome: Adequate for Discharge 02/28/2023 0835 by Donne Hazel, LPN Outcome: Progressing   Problem: Activity: Goal: Will verbalize the importance of balancing activity with adequate rest periods 02/28/2023 1302 by Donne Hazel, LPN Outcome: Adequate for Discharge 02/28/2023 0835 by Donne Hazel, LPN Outcome: Progressing Goal: Ability to tolerate increased activity will improve 02/28/2023 1302 by Donne Hazel, LPN Outcome: Adequate for Discharge 02/28/2023 0835 by Donne Hazel, LPN Outcome: Progressing   Problem: Coping: Goal: Ability to identify and utilize available resources and services will improve 02/28/2023 1302 by Donne Hazel, LPN Outcome: Adequate for Discharge 02/28/2023 0835 by Donne Hazel, LPN Outcome: Progressing   Problem: Life Cycle: Goal: Chance of risk for complications during the postpartum period will decrease 02/28/2023 1302 by Donne Hazel, LPN Outcome: Adequate for Discharge 02/28/2023 0835 by Donne Hazel, LPN Outcome: Progressing   Problem: Role Relationship: Goal: Ability to demonstrate positive interaction with newborn will improve 02/28/2023 1302 by Donne Hazel, LPN Outcome: Adequate for Discharge 02/28/2023 0835 by Donne Hazel, LPN Outcome: Progressing   Problem: Skin Integrity: Goal: Demonstration of wound healing without infection will improve 02/28/2023 1302 by Donne Hazel, LPN Outcome: Adequate for Discharge 02/28/2023 0835 by Donne Hazel, LPN Outcome: Progressing

## 2023-02-28 NOTE — Plan of Care (Signed)

## 2023-02-28 NOTE — Discharge Instructions (Signed)
WHAT TO LOOK OUT FOR: Fever of 100.4 or above Mastitis: feels like flu and breasts hurt Infection: increased pain, swelling or redness Blood clots golf ball size or larger Postpartum depression   Congratulations on your newest addition!

## 2023-03-01 ENCOUNTER — Encounter: Payer: Medicaid Other | Admitting: Family Medicine

## 2023-03-01 LAB — RH IG WORKUP (INCLUDES ABO/RH): Fetal Screen: NEGATIVE

## 2023-03-02 ENCOUNTER — Ambulatory Visit (HOSPITAL_BASED_OUTPATIENT_CLINIC_OR_DEPARTMENT_OTHER): Payer: Medicaid Other | Admitting: Physical Therapy

## 2023-03-05 ENCOUNTER — Encounter: Payer: Medicaid Other | Admitting: Family Medicine

## 2023-03-05 ENCOUNTER — Other Ambulatory Visit: Payer: Medicaid Other

## 2023-03-06 ENCOUNTER — Ambulatory Visit (INDEPENDENT_AMBULATORY_CARE_PROVIDER_SITE_OTHER): Payer: Medicaid Other

## 2023-03-06 VITALS — BP 122/82 | HR 84 | Ht 61.0 in | Wt 151.4 lb

## 2023-03-06 DIAGNOSIS — Z0130 Encounter for examination of blood pressure without abnormal findings: Secondary | ICD-10-CM

## 2023-03-06 NOTE — Progress Notes (Signed)
Blood Pressure Check Visit  Alexis Erickson [facility] is here for blood pressure check following spontaneous vaginal on 02/27/23. BP today is 122/82. Patient denies any dizzness blurred vision headache shortness of breath peripheral edema.   Patient currently taking nifedipine 30mg  PO daily.  Reviewed with Dr. Crissie Reese. Per provider, patient to continue taking nifedipine as prescribed until 3 days before PP visit on 03/30/23 (so by 03/27/23, patient is to stop taking nifedipine). Patient does not need return visit until PP visit, unless any new symptoms or concerns arise. Patient verbalized understanding and had no other questions or concerns.   Meryl Crutch, RN 03/06/2023  3:35 PM

## 2023-03-16 ENCOUNTER — Encounter: Payer: Self-pay | Admitting: Family Medicine

## 2023-03-30 ENCOUNTER — Encounter: Payer: Self-pay | Admitting: Family Medicine

## 2023-03-30 ENCOUNTER — Other Ambulatory Visit: Payer: Self-pay

## 2023-03-30 ENCOUNTER — Ambulatory Visit (INDEPENDENT_AMBULATORY_CARE_PROVIDER_SITE_OTHER): Payer: Medicaid Other | Admitting: Family Medicine

## 2023-03-30 DIAGNOSIS — Z8759 Personal history of other complications of pregnancy, childbirth and the puerperium: Secondary | ICD-10-CM

## 2023-03-30 DIAGNOSIS — Z6791 Unspecified blood type, Rh negative: Secondary | ICD-10-CM

## 2023-03-30 DIAGNOSIS — Z30011 Encounter for initial prescription of contraceptive pills: Secondary | ICD-10-CM

## 2023-03-30 DIAGNOSIS — B977 Papillomavirus as the cause of diseases classified elsewhere: Secondary | ICD-10-CM

## 2023-03-30 DIAGNOSIS — Z8632 Personal history of gestational diabetes: Secondary | ICD-10-CM | POA: Diagnosis not present

## 2023-03-30 MED ORDER — SLYND 4 MG PO TABS: 1.0000 | ORAL_TABLET | Freq: Every day | ORAL | 4 refills | Status: DC

## 2023-03-30 NOTE — Progress Notes (Signed)
Post Partum Visit Note  Alexis Erickson is a 27 y.o. G53P1011 female who presents for a postpartum visit. She is 4 weeks postpartum following a normal spontaneous vaginal delivery.  I have fully reviewed the prenatal and intrapartum course. The delivery was at 39.1 gestational weeks.  Anesthesia: epidural. Postpartum course has been uneventful. Baby is doing well. Baby is feeding by breast. Bleeding no bleeding. Bowel function is normal. Bladder function is normal. Patient is not sexually active. Contraception method is oral progesterone-only contraceptive. Postpartum depression screening: negative.   The pregnancy intention screening data noted above was reviewed. Potential methods of contraception were discussed. The patient elected to proceed with No data recorded.   Edinburgh Postnatal Depression Scale - 03/30/23 1108       Edinburgh Postnatal Depression Scale:  In the Past 7 Days   I have been able to laugh and see the funny side of things. 0    I have looked forward with enjoyment to things. 0    I have blamed myself unnecessarily when things went wrong. 0    I have been anxious or worried for no good reason. 0    I have felt scared or panicky for no good reason. 0    Things have been getting on top of me. 1    I have been so unhappy that I have had difficulty sleeping. 0    I have felt sad or miserable. 0    I have been so unhappy that I have been crying. 0    The thought of harming myself has occurred to me. 0    Edinburgh Postnatal Depression Scale Total 1             Health Maintenance Due  Topic Date Due   HPV VACCINES (1 - 3-dose series) Never done   COVID-19 Vaccine (3 - 2023-24 season) 04/07/2022   INFLUENZA VACCINE  03/08/2023    The following portions of the patient's history were reviewed and updated as appropriate: allergies, current medications, past family history, past medical history, past social history, past surgical history, and problem  list.  Review of Systems Pertinent items noted in HPI and remainder of comprehensive ROS otherwise negative.  Objective:  BP 125/76   Pulse 74   Ht 5' (1.524 m)   Wt 147 lb (66.7 kg)   LMP  (LMP Unknown)   Breastfeeding Yes   BMI 28.71 kg/m    General:  alert, cooperative, and appears stated age   Breasts:  not indicated  Lungs: Comfortalbe on room air  Wound N/a  GU exam:  not indicated        Assessment:   Postpartum care and examination  Encounter for initial prescription of contraceptive pills - Plan: Drospirenone (SLYND) 4 MG TABS  History of gestational diabetes  HPV (human papilloma virus) infection  Rh negative state in antepartum period  History of pre-eclampsia  Normal postpartum exam.   Plan:   Essential components of care per ACOG recommendations:  1.  Mood and well being: Patient with negative depression screening today. Reviewed local resources for support.  - Patient tobacco use? No.   - hx of drug use? No.    2. Infant care and feeding:  -Patient currently breastmilk feeding? Yes. Reviewed importance of draining breast regularly to support lactation.  -Social determinants of health (SDOH) reviewed in EPIC. No concerns  3. Sexuality, contraception and birth spacing - Patient does not want a pregnancy in the  next year.  Desired family size is 2 children.  - Reviewed reproductive life planning. Reviewed contraceptive methods based on pt preferences and effectiveness.  Patient desired oral progesterone-only contraceptive.   - Discussed birth spacing of 18 months  4. Sleep and fatigue -Encouraged family/partner/community support of 4 hrs of uninterrupted sleep to help with mood and fatigue  5. Physical Recovery  - Discussed patients delivery and complications. She describes her labor as good. - Patient had a Vaginal, no problems at delivery. Patient had a 1st degree laceration. Perineal healing reviewed. Patient expressed understanding - Patient  has urinary incontinence? No. - Patient is safe to resume physical and sexual activity  6.  Health Maintenance - HM due items addressed No - up to date - Last pap smear  Diagnosis  Date Value Ref Range Status  09/01/2022   Final   - Negative for intraepithelial lesion or malignancy (NILM)   Pap smear not done at today's visit.  -Breast Cancer screening indicated? No.   7. Chronic Disease/Pregnancy Condition follow up:  1. Postpartum care and examination   2. Encounter for initial prescription of contraceptive pills   3. History of gestational diabetes   4. HPV (human papilloma virus) infection   5. Rh negative state in antepartum period   6. History of pre-eclampsia    2. Switch from [employer] to [employer], emphasized importance of taking at the same time everyday  3. Plan for A1c at baby's 4 month WCC.   4. Repeat pap in 08/2023  5. Confirmed she received rhogam after delivery  6. Normotensive off meds, discussed increased lifetime risk of HTN and need for ASA in any future pregnancy.  Venora Maples, MD/MPH Attending Family Medicine Physician, [facility] for [facility], [facility]

## 2023-04-02 ENCOUNTER — Other Ambulatory Visit: Payer: Medicaid Other

## 2023-07-03 ENCOUNTER — Other Ambulatory Visit: Payer: Self-pay | Admitting: Family Medicine

## 2023-07-03 DIAGNOSIS — F419 Anxiety disorder, unspecified: Secondary | ICD-10-CM

## 2023-07-03 DIAGNOSIS — F32A Depression, unspecified: Secondary | ICD-10-CM

## 2023-07-03 NOTE — Progress Notes (Signed)
Edinburgh 12 at 4 month WCC. Referral accepted for IBH. Does not want meds.

## 2023-07-25 NOTE — BH Specialist Note (Signed)
 Integrated Behavioral Health via Telemedicine Visit  08/07/2023 Alexis Erickson [facility] [id number]  Number of Integrated Behavioral Health Clinician visits: 1- Initial Visit  Session Start time: 1024   Session End time: 1107  Total time in minutes: 43   Referring Provider: Donnice Carolus, MD Patient/Family location: Home [facility] Provider location: Center for [facility] at [facility]  All persons participating in visit: Patient Alexis Erickson and [facility] Kaicen Desena   Types of Service: Individual psychotherapy and Video visit  I connected with Alexis Erickson and/or Alexis Erickson's  infant son  via  Telephone or Engineer, Civil (consulting)  (Video is Surveyor, mining) and verified that I am speaking with the correct person using two identifiers. Discussed confidentiality: Yes   I discussed the limitations of telemedicine and the availability of in person appointments.  Discussed there is a possibility of technology failure and discussed alternative modes of communication if that failure occurs.  I discussed that engaging in this telemedicine visit, they consent to the provision of behavioral healthcare and the services will be billed under their insurance.  Patient and/or legal guardian expressed understanding and consented to Telemedicine visit: Yes   Presenting Concerns: Patient and/or family reports the following symptoms/concerns: Processing previous expectations of pregnancy and motherhood verses reality; coming to accept current situation and making decision to move back home to [place] , where she will have great support from friends and family.  Duration of problem: Postpartum; Severity of problem: moderate  Patient and/or Family's Strengths/Protective Factors: Social and Emotional competence, Concrete supports in place (healthy food, safe environments, etc.), Sense of purpose, and Physical Health (exercise,  healthy diet, medication compliance, etc.)  Goals Addressed: Patient will:  Reduce symptoms of: anxiety, depression, and stress   Increase knowledge and/or ability of: stress reduction   Demonstrate ability to: Increase healthy adjustment to current life circumstances, Increase adequate support systems for patient/family, and Increase motivation to adhere to plan of care  Progress towards Goals: Ongoing  Interventions: Interventions utilized:  Psychoeducation and/or Health Education, Link to [employer], and Supportive Reflection Standardized Assessments completed: Not Needed  Patient and/or Family Response: Patient agrees with treatment plan.   Assessment: Patient currently experiencing Adjustment disorder with mixed anxious and depressed mood.   Patient may benefit from psychoeducation and brief therapeutic interventions regarding coping with symptoms of depression and anxiety .  Plan: Follow up with behavioral health clinician on : Two weeks Behavioral recommendations:  -Continue prioritizing healthy self-care (regular meals, adequate rest; long-distance support friends and family, as needed)  -Consider new mom support group as needed at either [email or web address].net or [email or web address] for additional support -Read through information on After Visit Summary; use as needed and discussed -Continue making plans to move close to family and friends for optimal support in March 2025 Referral(s): Integrated Art Gallery Manager (In Clinic) and [employer] Resources:  New mom support; [facility]  I discussed the assessment and treatment plan with the patient and/or parent/guardian. They were provided an opportunity to ask questions and all were answered. They agreed with the plan and demonstrated an understanding of the instructions.   They were advised to call back or seek an in-person evaluation if the symptoms worsen or if the condition fails to improve as  anticipated.  Warren JAYSON Mering, LCSW     03/30/2023   11:08 AM 02/27/2023    9:30 AM  [place] Postnatal Depression Scale Screening Tool  I have been able to laugh and  see the funny side of things. 0 0  I have looked forward with enjoyment to things. 0 0  I have blamed myself unnecessarily when things went wrong. 0 1  I have been anxious or worried for no good reason. 0 0  I have felt scared or panicky for no good reason. 0 0  Things have been getting on top of me. 1 0  I have been so unhappy that I have had difficulty sleeping. 0 0  I have felt sad or miserable. 0 0  I have been so unhappy that I have been crying. 0 0  The thought of harming myself has occurred to me. 0 0  Edinburgh Postnatal Depression Scale Total 1 1

## 2023-08-07 ENCOUNTER — Ambulatory Visit: Payer: Medicaid Other

## 2023-08-07 DIAGNOSIS — F4323 Adjustment disorder with mixed anxiety and depressed mood: Secondary | ICD-10-CM

## 2023-08-07 NOTE — Patient Instructions (Signed)
Center for [facility] at [facility] [address] [place], [place] [address] [phone] (main office) [phone] [facility] office)  New Parent Support Groups [email or web address]  [email or web address]

## 2023-08-13 NOTE — BH Specialist Note (Deleted)
 Integrated Behavioral Health via Telemedicine Visit  08/13/2023 Alexis Erickson [facility] [id number]  Number of Integrated Behavioral Health Clinician visits: 1- Initial Visit  Session Start time: 1024   Session End time: 1107  Total time in minutes: 43   Referring Provider: *** Patient/Family location: [facility] Provider location: *** All persons participating in visit: *** Types of Service: {CHL AMB TYPE OF SERVICE:[phone]}  I connected with Alexis Erickson and/or Alexis Erickson's {family members:20773} via  Telephone or Engineer, Civil (consulting)  (Video is Surveyor, mining) and verified that I am speaking with the correct person using two identifiers. Discussed confidentiality: {YES/NO:21197}  I discussed the limitations of telemedicine and the availability of in person appointments.  Discussed there is a possibility of technology failure and discussed alternative modes of communication if that failure occurs.  I discussed that engaging in this telemedicine visit, they consent to the provision of behavioral healthcare and the services will be billed under their insurance.  Patient and/or legal guardian expressed understanding and consented to Telemedicine visit: {YES/NO:21197}  Presenting Concerns: Patient and/or family reports the following symptoms/concerns: *** Duration of problem: ***; Severity of problem: {Mild/Moderate/Severe:20260}  Patient and/or Family's Strengths/Protective Factors: {CHL AMB BH PROTECTIVE FACTORS:[phone]}  Goals Addressed: Patient will:  Reduce symptoms of: {IBH Symptoms:21014056}   Increase knowledge and/or ability of: {IBH Patient Tools:21014057}   Demonstrate ability to: {IBH Goals:21014053}  Progress towards Goals: {CHL AMB BH PROGRESS TOWARDS GOALS:[phone]}  Interventions: Interventions utilized:  {IBH Interventions:21014054} Standardized Assessments completed: {IBH Screening  Tools:21014051}  Patient and/or Family Response: ***  Assessment: Patient currently experiencing ***.   Patient may benefit from ***.  Plan: Follow up with behavioral health clinician on : *** Behavioral recommendations: *** Referral(s): {IBH Referrals:21014055}  I discussed the assessment and treatment plan with the patient and/or parent/guardian. They were provided an opportunity to ask questions and all were answered. They agreed with the plan and demonstrated an understanding of the instructions.   They were advised to call back or seek an in-person evaluation if the symptoms worsen or if the condition fails to improve as anticipated.  [name] C [name], LCSW

## 2023-11-14 ENCOUNTER — Encounter (HOSPITAL_COMMUNITY): Payer: Self-pay | Admitting: *Deleted

## 2023-11-14 ENCOUNTER — Inpatient Hospital Stay (HOSPITAL_COMMUNITY)
Admission: AD | Admit: 2023-11-14 | Discharge: 2023-11-14 | Disposition: A | Discharge status: Home / Self Care | Attending: Obstetrics and Gynecology | Admitting: Obstetrics and Gynecology

## 2023-11-14 DIAGNOSIS — N3000 Acute cystitis without hematuria: Secondary | ICD-10-CM | POA: Insufficient documentation

## 2023-11-14 DIAGNOSIS — Z3201 Encounter for pregnancy test, result positive: Secondary | ICD-10-CM | POA: Diagnosis not present

## 2023-11-14 DIAGNOSIS — O26811 Pregnancy related exhaustion and fatigue, first trimester: Secondary | ICD-10-CM

## 2023-11-14 DIAGNOSIS — R5383 Other fatigue: Secondary | ICD-10-CM | POA: Insufficient documentation

## 2023-11-14 LAB — URINALYSIS, ROUTINE W REFLEX MICROSCOPIC
Bilirubin Urine: NEGATIVE
Glucose, UA: NEGATIVE mg/dL
Hgb urine dipstick: NEGATIVE
Ketones, ur: NEGATIVE mg/dL
Nitrite: NEGATIVE
Protein, ur: NEGATIVE mg/dL
Specific Gravity, Urine: 1.009 (ref 1.005–1.030)
WBC, UA: >50 WBC/hpf (ref 0–5)
pH: 7 (ref 5.0–8.0)

## 2023-11-14 LAB — COMPREHENSIVE METABOLIC PANEL WITH GFR
ALT: 10 U/L (ref 0–44)
AST: 13 U/L — ABNORMAL LOW (ref 15–41)
Albumin: 3.5 g/dL (ref 3.5–5.0)
Alkaline Phosphatase: 43 U/L (ref 38–126)
Anion gap: 11 (ref 5–15)
BUN: 6 mg/dL (ref 6–20)
CO2: 23 mmol/L (ref 22–32)
Calcium: 9.5 mg/dL (ref 8.9–10.3)
Chloride: 99 mmol/L (ref 98–111)
Creatinine, Ser: 0.55 mg/dL (ref 0.44–1.00)
GFR, Estimated: >60 mL/min (ref >60)
Glucose, Bld: 78 mg/dL (ref 70–99)
Potassium: 3.4 mmol/L — ABNORMAL LOW (ref 3.5–5.1)
Sodium: 133 mmol/L — ABNORMAL LOW (ref 135–145)
Total Bilirubin: 1.1 mg/dL (ref 0.0–1.2)
Total Protein: 7.7 g/dL (ref 6.5–8.1)

## 2023-11-14 LAB — CBC
HCT: 38.5 % (ref 36.0–46.0)
Hemoglobin: 13.3 g/dL (ref 12.0–15.0)
MCH: 27.8 pg (ref 26.0–34.0)
MCHC: 34.5 g/dL (ref 30.0–36.0)
MCV: 80.5 fL (ref 80.0–100.0)
Platelets: 185 10*3/uL (ref 150–400)
RBC: 4.78 MIL/uL (ref 3.87–5.11)
RDW: 14.7 % (ref 11.5–15.5)
WBC: 12.4 10*3/uL — ABNORMAL HIGH (ref 4.0–10.5)
nRBC: 0 % (ref 0.0–0.2)

## 2023-11-14 LAB — HCG, QUANTITATIVE, PREGNANCY: hCG, Beta Chain, Quant, S: >250000 m[IU]/mL — ABNORMAL HIGH (ref <5)

## 2023-11-14 LAB — POCT PREGNANCY, URINE: Preg Test, Ur: POSITIVE — AB

## 2023-11-14 MED ORDER — SCOPOLAMINE 1 MG/3DAYS TD PT72: 1.0000 | MEDICATED_PATCH | TRANSDERMAL | 12 refills | Status: AC

## 2023-11-14 MED ORDER — PRENATAL COMPLETE 14-0.4 MG PO TABS: 1.0000 | ORAL_TABLET | Freq: Every day | ORAL | 3 refills | Status: AC

## 2023-11-14 MED ORDER — ONDANSETRON HCL 4 MG PO TABS: 8.0000 mg | ORAL_TABLET | Freq: Two times a day (BID) | ORAL | 0 refills | Status: AC

## 2023-11-14 MED ORDER — NITROFURANTOIN MONOHYD MACRO 100 MG PO CAPS: 100.0000 mg | ORAL_CAPSULE | Freq: Two times a day (BID) | ORAL | 0 refills | Status: AC

## 2023-11-14 NOTE — MAU Note (Signed)
 Pt discharged by Marcell Barlow, NP.  No pain.

## 2023-11-14 NOTE — Discharge Instructions (Signed)

## 2023-11-14 NOTE — MAU Note (Signed)
.  Alexis Erickson is a 28 y.o. at Unknown here in MAU reporting: she thinks she might be pregnant. Had a baby nine months ago and is beast feeding has had some spotting that may have been a period in January. Has  been having a headache off and on x1 week. Feeling dizzy and fatigued and nauseated.  Took a HPT and had a faint line. Not sure if it was accurate.  Not taking anything for headache  LMP: 08/19/2023 Onset of complaint:1 week Pain Score: 5 Vitals:   11/14/23 1849  BP: 119/76  Pulse: 90  Resp: 18  Temp: 98.5 F (36.9 C)     FHT: na  Lab orders placed from triage: UPT

## 2023-11-14 NOTE — MAU Provider Note (Signed)
 S [name] Alexis Erickson is a 28 y.o. [phone] patient who presents to MAU today with complaint of recently feeling fatigued, dizziness, and overall malaise. She reports some mild Nausea but denies vomiting. She has a 1 month old that she was breastfeeding till ~ 62 months of age and not using contraception. Patient reports that d/t breast feeding she hasn't had a normal LMP but took a UPT at home yesterday and had a faint positive result. So she presents today to confirm pregnancy. Denies any VB, LOF and reports pregnancy was not planned.  Patient denied need for taking any medication for dizziness or fatigue and is requesting that she be notified later of results of the blood tests with appropriate follow up via my chart or Phone call since she is here with infant and FOB.    Review of Systems  Constitutional:  Positive for malaise/fatigue.  Gastrointestinal:  Positive for nausea.  Neurological:  Positive for dizziness and headaches.    O BP 119/76   Pulse 90   Temp 98.5 F (36.9 C)   Resp 18   Ht 5\' 1"  (1.549 m)   Wt 67.1 kg   LMP 08/19/2023 (Approximate)   Breastfeeding Yes   BMI 27.96 kg/m  Physical Exam Vitals and nursing note reviewed. Exam conducted with a chaperone present.  Constitutional:      Appearance: She is well-developed.  HENT:     Head: Normocephalic.     Nose: Nose normal.     Mouth/Throat:     Mouth: Mucous membranes are moist.  Cardiovascular:     Rate and Rhythm: Normal rate and regular rhythm.  Pulmonary:     Effort: Pulmonary effort is normal.     Breath sounds: Normal breath sounds.  Musculoskeletal:        General: Normal range of motion.     Cervical back: Normal range of motion.  Skin:    General: Skin is warm.  Neurological:     Mental Status: She is alert and oriented to person, place, and time.  Psychiatric:        Mood and Affect: Mood normal.        Behavior: Behavior normal.    Orders Placed This Encounter  Procedures    Urinalysis, Routine w reflex microscopic -Urine, Clean Catch    Standing Status:   Standing    Number of Occurrences:   1    Specimen Source:   Urine, Clean Catch [76]   hCG, quantitative, pregnancy    Standing Status:   Standing    Number of Occurrences:   1   CBC    Standing Status:   Standing    Number of Occurrences:   1   Comprehensive metabolic panel    Standing Status:   Standing    Number of Occurrences:   1   Pregnancy, urine POC    Standing Status:   Standing    Number of Occurrences:   1   Discharge patient Discharge disposition: 01-Home or Self Care; Discharge patient date: 11/14/2023    Standing Status:   Standing    Number of Occurrences:   1    Discharge disposition:   01-Home or Self Care [1]    Discharge patient date:   11/14/2023      Results for orders placed or performed during the hospital encounter of 11/14/23 (from the past 24 hours)  Pregnancy, urine POC     Status: Abnormal   Collection Time: 11/14/23  6:55 PM  Result Value Ref Range   Preg Test, Ur POSITIVE (A) NEGATIVE  Urinalysis, Routine w reflex microscopic -Urine, Clean Catch     Status: Abnormal   Collection Time: 11/14/23  6:59 PM  Result Value Ref Range   Color, Urine YELLOW YELLOW   APPearance CLOUDY (A) CLEAR   Specific Gravity, Urine 1.009 1.005 - 1.030   pH 7.0 5.0 - 8.0   Glucose, UA NEGATIVE NEGATIVE mg/dL   Hgb urine dipstick NEGATIVE NEGATIVE   Bilirubin Urine NEGATIVE NEGATIVE   Ketones, ur NEGATIVE NEGATIVE mg/dL   Protein, ur NEGATIVE NEGATIVE mg/dL   Nitrite NEGATIVE NEGATIVE   Leukocytes,Ua LARGE (A) NEGATIVE   RBC / HPF 0-5 0 - 5 RBC/hpf   WBC, UA >50 0 - 5 WBC/hpf   Bacteria, UA MANY (A) NONE SEEN   Squamous Epithelial / HPF 21-50 0 - 5 /HPF  hCG, quantitative, pregnancy     Status: Abnormal   Collection Time: 11/14/23  7:45 PM  Result Value Ref Range   hCG, Beta Chain, Quant, S >250,000 (H) <5 mIU/mL  CBC     Status: Abnormal   Collection Time: 11/14/23  7:45 PM   Result Value Ref Range   WBC 12.4 (H) 4.0 - 10.5 K/uL   RBC 4.78 3.87 - 5.11 MIL/uL   Hemoglobin 13.3 12.0 - 15.0 g/dL   HCT 60.4 54.0 - 98.1 %   MCV 80.5 80.0 - 100.0 fL   MCH 27.8 26.0 - 34.0 pg   MCHC 34.5 30.0 - 36.0 g/dL   RDW 19.1 47.8 - 29.5 %   Platelets 185 150 - 400 K/uL   nRBC 0.0 0.0 - 0.2 %  Comprehensive metabolic panel     Status: Abnormal   Collection Time: 11/14/23  7:45 PM  Result Value Ref Range   Sodium 133 (L) 135 - 145 mmol/L   Potassium 3.4 (L) 3.5 - 5.1 mmol/L   Chloride 99 98 - 111 mmol/L   CO2 23 22 - 32 mmol/L   Glucose, Bld 78 70 - 99 mg/dL   BUN 6 6 - 20 mg/dL   Creatinine, Ser 6.21 0.44 - 1.00 mg/dL   Calcium 9.5 8.9 - 30.8 mg/dL   Total Protein 7.7 6.5 - 8.1 g/dL   Albumin 3.5 3.5 - 5.0 g/dL   AST 13 (L) 15 - 41 U/L   ALT 10 0 - 44 U/L   Alkaline Phosphatase 43 38 - 126 U/L   Total Bilirubin 1.1 0.0 - 1.2 mg/dL   GFR, Estimated >65 >78 mL/min   Anion gap 11 5 - 15      MDM  HIGH  I have reviewed the patient chart and performed the physical exam . I have ordered & interpreted the lab results and reviewed and will notify patient of the results and appropriate follow up after resulted at patient request - Unable to stay for results d/t infant with her  Medications ordered as stated below.  A/P as described below.  Counseling and education provided and patient agreeable  with plan as described below. Verbalized understanding.    ASSESSMENT Medical screening exam complete  1. Positive urine pregnancy test (Primary)  2. Pregnancy related fatigue in first trimester  3. Acute cystitis without hematuria     PLAN  @ 2112  Spoke with patient after ID x 2 ( HIPAA Compliance) when the hcg levels resulted and informed patient that d/t the hcg level > 250,000 we need to perform  an ultrasound to confirm an IUP. Patient reports that she will return tomorrow morning for the ultrasound and I also informed the patient that she has a UTI and sent RX  for Macrobid. Patient reported she will arrive tomorrow around 10 AM and I will personally see the patient and follow up and perform the necessary counseling d/t unexpected pregnacy   Meds ordered this encounter  Medications   Prenatal Vit-Fe Fumarate-FA (PRENATAL COMPLETE) 14-0.4 MG TABS    Sig: Take 1 tablet by mouth daily.    Dispense:  60 tablet    Refill:  3    Supervising Provider:   Reva Bores [[id number]]   ondansetron (ZOFRAN) 4 MG tablet    Sig: Take 2 tablets (8 mg total) by mouth 2 (two) times daily.    Dispense:  20 tablet    Refill:  0    Supervising Provider:   Reva Bores [[id number]]   scopolamine (TRANSDERM-SCOP) 1 MG/3DAYS    Sig: Place 1 patch (1.5 mg total) onto the skin every 3 (three) days.    Dispense:  10 patch    Refill:  12    Supervising Provider:   Reva Bores [[id number]]   nitrofurantoin, macrocrystal-monohydrate, (MACROBID) 100 MG capsule    Sig: Take 1 capsule (100 mg total) by mouth 2 (two) times daily.    Dispense:  10 capsule    Refill:  0    Supervising Provider:   Reva Bores [[id number]]      Discharge from MAU in stable condition See AVS for full description of educational information and instructions provided to the patient at time of discharge Warning signs for worsening condition that would warrant emergency follow-up discussed Patient may return to MAU as needed   Colman Cater, NP 11/14/2023 9:17 PM

## 2023-11-15 ENCOUNTER — Inpatient Hospital Stay (HOSPITAL_COMMUNITY)
Admission: AD | Admit: 2023-11-15 | Discharge: 2023-11-15 | Disposition: A | Discharge status: Home / Self Care | Payer: Self-pay | Attending: Obstetrics & Gynecology | Admitting: Obstetrics & Gynecology

## 2023-11-15 ENCOUNTER — Encounter (HOSPITAL_COMMUNITY): Payer: Self-pay | Admitting: Obstetrics & Gynecology

## 2023-11-15 ENCOUNTER — Inpatient Hospital Stay (HOSPITAL_COMMUNITY)

## 2023-11-15 DIAGNOSIS — O209 Hemorrhage in early pregnancy, unspecified: Secondary | ICD-10-CM | POA: Insufficient documentation

## 2023-11-15 DIAGNOSIS — R42 Dizziness and giddiness: Secondary | ICD-10-CM | POA: Insufficient documentation

## 2023-11-15 DIAGNOSIS — Z3A09 9 weeks gestation of pregnancy: Secondary | ICD-10-CM | POA: Insufficient documentation

## 2023-11-15 DIAGNOSIS — Z3491 Encounter for supervision of normal pregnancy, unspecified, first trimester: Secondary | ICD-10-CM

## 2023-11-15 DIAGNOSIS — O99281 Endocrine, nutritional and metabolic diseases complicating pregnancy, first trimester: Secondary | ICD-10-CM | POA: Diagnosis not present

## 2023-11-15 DIAGNOSIS — Z3A12 12 weeks gestation of pregnancy: Secondary | ICD-10-CM

## 2023-11-15 DIAGNOSIS — E876 Hypokalemia: Secondary | ICD-10-CM | POA: Insufficient documentation

## 2023-11-15 HISTORY — DX: Urinary tract infection, site not specified: N39.0

## 2023-11-15 HISTORY — DX: Anxiety disorder, unspecified: F41.9

## 2023-11-15 HISTORY — DX: Gestational (pregnancy-induced) hypertension without significant proteinuria, unspecified trimester: O13.9

## 2023-11-15 MED ORDER — POTASSIUM CHLORIDE CRYS ER 20 MEQ PO TBCR: 40.0000 meq | EXTENDED_RELEASE_TABLET | Freq: Once | ORAL | Status: DC

## 2023-11-15 MED ORDER — POTASSIUM CHLORIDE CRYS ER 20 MEQ PO TBCR: 20.0000 meq | EXTENDED_RELEASE_TABLET | Freq: Two times a day (BID) | ORAL | 0 refills | Status: AC

## 2023-11-15 NOTE — MAU Provider Note (Signed)
 Alexis Erickson Abisai Coble is a 28 y.o. [phone] patient who presents to MAU today as a follow up to yesterday's visit. Her Hcg Level was >250,000 and d/t  the expectation that her pregnancy was early an ultrasound was not performed at the time of visit yesterday. Today she presented for her ultrasound to confirm and IUP and for options counseling as this was an unplanned pregnancy. She was also  c/o dizziness yesterday and overall malaise and her K Level was 3.4 yesterday will plan to discuss this with her and offer her some Potassium replacement   Review of Systems  All other systems reviewed and are negative.   O BP 102/61 (BP Location: Right Arm)   Pulse 75   Temp 98.4 F (36.9 C) (Oral)   Resp 17   LMP 08/19/2023 (Approximate)   SpO2 100%   Blood pressure 102/61, pulse 75, temperature 98.4 F (36.9 C), temperature source Oral, resp. rate 17, last menstrual period 08/19/2023, SpO2 100%, currently breastfeeding.  Orthostatic BP's NM ( See Flow Sheet)   Physical Exam Vitals and nursing note reviewed. Exam conducted with a chaperone present.  HENT:     Head: Normocephalic.     Nose: Nose normal.  Cardiovascular:     Rate and Rhythm: Normal rate and regular rhythm.  Pulmonary:     Effort: Pulmonary effort is normal.     Breath sounds: Normal breath sounds.  Abdominal:     Palpations: Abdomen is soft.  Musculoskeletal:        General: Normal range of motion.     Cervical back: Normal range of motion.  Skin:    General: Skin is warm.  Neurological:     Mental Status: She is alert and oriented to person, place, and time.  Psychiatric:        Attention and Perception: Attention normal.        Mood and Affect: Mood normal.        Speech: Speech normal.        Behavior: Behavior normal.        Thought Content: Thought content normal.        Judgment: Judgment normal.     Orders Placed This Encounter  Procedures   US OB Comp Less 14 Wks    Standing Status:   Standing     Number of Occurrences:   1    Symptom/Reason for Exam:   Vaginal bleeding affecting early pregnancy [[id number]]   Discharge patient Discharge disposition: 01-Home or Self Care; Discharge patient date: 11/15/2023    Standing Status:   Standing    Number of Occurrences:   1    Discharge disposition:   01-Home or Self Care [1]    Discharge patient date:   11/15/2023      Results for orders placed or performed during the hospital encounter of 11/14/23 (from the past 24 hours)  Pregnancy, urine POC     Status: Abnormal   Collection Time: 11/14/23  6:55 PM  Result Value Ref Range   Preg Test, Ur POSITIVE (A) NEGATIVE  Urinalysis, Routine w reflex microscopic -Urine, Clean Catch     Status: Abnormal   Collection Time: 11/14/23  6:59 PM  Result Value Ref Range   Color, Urine YELLOW YELLOW   APPearance CLOUDY (A) CLEAR   Specific Gravity, Urine 1.009 1.005 - 1.030   pH 7.0 5.0 - 8.0   Glucose, UA NEGATIVE NEGATIVE mg/dL   Hgb urine dipstick NEGATIVE NEGATIVE  Bilirubin Urine NEGATIVE NEGATIVE   Ketones, ur NEGATIVE NEGATIVE mg/dL   Protein, ur NEGATIVE NEGATIVE mg/dL   Nitrite NEGATIVE NEGATIVE   Leukocytes,Ua LARGE (A) NEGATIVE   RBC / HPF 0-5 0 - 5 RBC/hpf   WBC, UA >50 0 - 5 WBC/hpf   Bacteria, UA MANY (A) NONE SEEN   Squamous Epithelial / HPF 21-50 0 - 5 /HPF  hCG, quantitative, pregnancy     Status: Abnormal   Collection Time: 11/14/23  7:45 PM  Result Value Ref Range   hCG, Beta Chain, Quant, S >250,000 (H) <5 mIU/mL  CBC     Status: Abnormal   Collection Time: 11/14/23  7:45 PM  Result Value Ref Range   WBC 12.4 (H) 4.0 - 10.5 K/uL   RBC 4.78 3.87 - 5.11 MIL/uL   Hemoglobin 13.3 12.0 - 15.0 g/dL   HCT 16.1 09.6 - 04.5 %   MCV 80.5 80.0 - 100.0 fL   MCH 27.8 26.0 - 34.0 pg   MCHC 34.5 30.0 - 36.0 g/dL   RDW 40.9 81.1 - 91.4 %   Platelets 185 150 - 400 K/uL   nRBC 0.0 0.0 - 0.2 %  Comprehensive metabolic panel     Status: Abnormal   Collection Time: 11/14/23  7:45 PM   Result Value Ref Range   Sodium 133 (L) 135 - 145 mmol/L   Potassium 3.4 (L) 3.5 - 5.1 mmol/L   Chloride 99 98 - 111 mmol/L   CO2 23 22 - 32 mmol/L   Glucose, Bld 78 70 - 99 mg/dL   BUN 6 6 - 20 mg/dL   Creatinine, Ser 7.82 0.44 - 1.00 mg/dL   Calcium 9.5 8.9 - 95.6 mg/dL   Total Protein 7.7 6.5 - 8.1 g/dL   Albumin 3.5 3.5 - 5.0 g/dL   AST 13 (L) 15 - 41 U/L   ALT 10 0 - 44 U/L   Alkaline Phosphatase 43 38 - 126 U/L   Total Bilirubin 1.1 0.0 - 1.2 mg/dL   GFR, Estimated >21 >30 mL/min   Anion gap 11 5 - 15      Orders Placed This Encounter  Procedures   US OB Comp Less 14 Wks    Standing Status:   Standing    Number of Occurrences:   1    Symptom/Reason for Exam:   Vaginal bleeding affecting early pregnancy [[id number]]   Discharge patient Discharge disposition: 01-Home or Self Care; Discharge patient date: 11/15/2023    Standing Status:   Standing    Number of Occurrences:   1    Discharge disposition:   01-Home or Self Care [1]    Discharge patient date:   11/15/2023     MDM   I have reviewed the patient chart and performed the physical exam . I have ordered & interpreted the lab results and reviewed and interpreted the ultrasound images ~ [redacted] weeks GA with + FHR  Medications ordered as stated below.  A/P as described below.  Counseling and education provided and patient agreeable  with plan as described below. Verbalized understanding.    ASSESSMENT Medical screening exam complete   1. Normal IUP (intrauterine pregnancy) on prenatal ultrasound, first trimester (Primary)  2. [redacted] weeks gestation of pregnancy  3. Hypokalemia  4. Dizziness    PLAN  Meds ordered this encounter  Medications   DISCONTD: potassium chloride SA (KLOR-CON M) CR tablet 40 mEq   potassium chloride SA (KLOR-CON M) 20 MEQ tablet  Sig: Take 1 tablet (20 mEq total) by mouth 2 (two) times daily for 2 days.    Dispense:  8 tablet    Refill:  0    Supervising Provider:   Reva Bores  [[id number]]     Will contact [facility] to have them schedule Initial PNV ( Message sent) Discharge from MAU in stable condition and verbalized understanding of the plan as described above and all questions were answered to her satisfaction See AVS for full description of educational information and instructions provided to the patient at time of discharge Patient given the option of transfer to [facility] for further evaluation or seek care in outpatient facility of choice  List of options for follow-up given  Warning signs for worsening condition that would warrant emergency follow-up discussed Patient may return to MAU as needed   Colman Cater, NP 11/15/2023 1:07 PM

## 2023-11-15 NOTE — MAU Note (Signed)
 Alexis Erickson is a 28 y.o. at [redacted]w[redacted]d here in MAU reporting: doing ok, feeling "a little bit of the same; fatigue, little dizzy and HA".  Did not take any Tylenol or anything for the HA.  No bleeding.  No abd pain.  Here today for [place] as instructed Onset of complaint: ongoing Pain score: moderate Vitals:   11/15/23 1131  BP: 102/61  Pulse: 75  Resp: 17  Temp: 98.4 F (36.9 C)  SpO2: 100%     FHT:172 Lab orders placed from triage:

## 2023-12-11 ENCOUNTER — Telehealth

## 2024-01-03 ENCOUNTER — Ambulatory Visit
Admission: EM | Admit: 2024-01-03 | Discharge: 2024-01-03 | Disposition: A | Discharge status: Home / Self Care | Attending: Physician Assistant | Admitting: Physician Assistant

## 2024-01-03 DIAGNOSIS — T7840XA Allergy, unspecified, initial encounter: Secondary | ICD-10-CM | POA: Diagnosis not present

## 2024-01-03 MED ORDER — PREDNISONE 50 MG PO TABS: ORAL_TABLET | ORAL | 0 refills | Status: AC

## 2024-01-03 NOTE — ED Triage Notes (Signed)
"  I am having a reaction to something that has bitten me on my right arm (x3), Right hip (x1) and Right buttocks (x1)". The places on my arms are really swollen". No fever.

## 2024-01-03 NOTE — Discharge Instructions (Signed)
 Benadryl  25 mg every 4 hours.  Prednsione as directed

## 2024-01-03 NOTE — ED Provider Notes (Signed)
 [name]    CSN: [id number] Arrival date & time: 01/03/24  0825      History   Chief Complaint Chief Complaint  Patient presents with   Allergic Reaction    HPI Alexis Erickson is a 28 y.o. female.   Patient complains of bites to her right arm her right buttock and her right side.  Patient is unsure of what bit her.  She noticed the areas after walking her dog.  Patient reports increased swelling.  She has been applying ice with minimal relief.  Patient denies any fever or chills.  No known tick bites.  Patient reports red areas are getting larger.   Allergic Reaction   Past Medical History:  Diagnosis Date   Anxiety    Chlamydia infection 01/09/2022   Gestational diabetes    HPV (human papilloma virus) infection    Pregnancy induced hypertension    UTI (urinary tract infection)     Patient Active Problem List   Diagnosis Date Noted   History of pre-eclampsia 03/30/2023   History of gestational diabetes 12/07/2022   Abnormal genetic test 09/08/2022   HPV (human papilloma virus) infection 09/06/2022   Rh negative state in antepartum period 09/01/2022    Past Surgical History:  Procedure Laterality Date   DILATION AND EVACUATION N/A 01/19/2022   Procedure: DILATATION AND EVACUATION;  Surgeon: Julianne Octave, MD;  Location: [facility] OR;  Service: Gynecology;  Laterality: N/A;   TOOTH EXTRACTION      OB History     Gravida  3   Para  1   Term  1   Preterm      AB  1   Living  1      SAB  1   IAB      Ectopic      Multiple  0   Live Births  1        Obstetric Comments  2024 GDM, developed Pre-E, was on Magnesium          Home Medications    Prior to Admission medications   Medication Sig Start Date End Date Taking? Authorizing Provider  predniSONE  (DELTASONE ) 50 MG tablet One tablet a day for 5 days 01/03/24  Yes Dorothey Gate K, PA-C  nitrofurantoin , macrocrystal-monohydrate, (MACROBID ) 100 MG capsule Take 1  capsule (100 mg total) by mouth 2 (two) times daily. 11/14/23   Cooleen, Darren Em, NP  ondansetron  (ZOFRAN ) 4 MG tablet Take 2 tablets (8 mg total) by mouth 2 (two) times daily. 11/14/23   Cooleen, Darren Em, NP  potassium chloride  SA (KLOR-CON  M) 20 MEQ tablet Take 1 tablet (20 mEq total) by mouth 2 (two) times daily for 2 days. 11/15/23 11/17/23  Cherlynn Cornfield, NP  Prenatal Vit-Fe Fumarate-FA (PRENATAL COMPLETE) 14-0.4 MG TABS Take 1 tablet by mouth daily. 11/14/23   Cherlynn Cornfield, NP  Prenatal Vit-Fe Fumarate-FA (PRENATAL MULTIVITAMIN) TABS tablet Take 1 tablet by mouth daily at 12 noon.    [provider]  scopolamine  (TRANSDERM-SCOP) 1 MG/3DAYS Place 1 patch (1.5 mg total) onto the skin every 3 (three) days. 11/14/23   Cooleen, Darren Em, NP    Family History Family History  Problem Relation Age of Onset   Diabetes Mother    Healthy Father    Cancer Maternal Grandmother        breast    Social History Social History   Tobacco Use   Smoking status: Never   Smokeless tobacco:  Never  Vaping Use   Vaping status: Never Used  Substance Use Topics   Alcohol use: Not Currently    Comment: occasional wine   Drug use: Not Currently    Types: Marijuana    Comment: Last use Early May 2023     Allergies   Patient has no known allergies.   Review of Systems Review of Systems  All other systems reviewed and are negative.    Physical Exam Triage Vital Signs ED Triage Vitals  Encounter Vitals Group     BP 01/03/24 0842 95/64     Systolic BP Percentile --      Diastolic BP Percentile --      Pulse Rate 01/03/24 0842 69     Resp 01/03/24 0842 18     Temp 01/03/24 0842 97.9 F (36.6 C)     Temp Source 01/03/24 0842 Oral     SpO2 01/03/24 0842 98 %     Weight 01/03/24 0840 147 lb 14.9 oz (67.1 kg)     Height 01/03/24 0840 5\' 1"  (1.549 m)     Head Circumference --      Peak Flow --      Pain Score 01/03/24 0837 0     Pain Loc --      Pain Education --      Exclude from  Growth Chart --    No data found.  Updated Vital Signs BP 95/64 (BP Location: Right Arm)   Pulse 69   Temp 97.9 F (36.6 C) (Oral)   Resp 18   Ht 5\' 1"  (1.549 m)   Wt 67.1 kg   LMP 08/19/2023 (Approximate)   SpO2 98%   Breastfeeding Unknown   BMI 27.95 kg/m   Visual Acuity Right Eye Distance:   Left Eye Distance:   Bilateral Distance:    Right Eye Near:   Left Eye Near:    Bilateral Near:     Physical Exam Vitals and nursing note reviewed.  Constitutional:      Appearance: She is well-developed.  HENT:     Head: Normocephalic.  Cardiovascular:     Rate and Rhythm: Normal rate.  Pulmonary:     Effort: Pulmonary effort is normal.     Breath sounds: Normal breath sounds.  Abdominal:     General: There is no distension.  Musculoskeletal:        General: Normal range of motion.     Cervical back: Normal range of motion.  Skin:    Comments: 2 large red raised areas right forearm and upper arm, 10 cm red raised area right buttock, 2 cm area right abdomen with center of induration  Neurological:     General: No focal deficit present.     Mental Status: She is alert and oriented to person, place, and time.      UC Treatments / Results  Labs (all labs ordered are listed, but only abnormal results are displayed) Labs Reviewed - No data to display  EKG   Radiology No results found.  Procedures Procedures (including critical care time)  Medications Ordered in UC Medications - No data to display  Initial Impression / Assessment and Plan / UC Course  I have reviewed the triage vital signs and the nursing notes.  Pertinent labs & imaging results that were available during my care of the patient were reviewed by me and considered in my medical decision making (see chart for details).     Red swollen areas appear  to be secondary to insect bite.  No shortness of breath or systemic symptoms patient advised Benadryl  continue cool compresses prednisone  for the  next 5 days patient advised to return if symptoms worsen or change or if she experiences any throat swelling or shortness of breath Final Clinical Impressions(s) / UC Diagnoses   Final diagnoses:  Allergic reaction, initial encounter   Discharge Instructions      Benadryl  25 mg every 4 hours.  Prednsione as directed   ED Prescriptions     Medication Sig Dispense Auth. Provider   predniSONE  (DELTASONE ) 50 MG tablet One tablet a day for 5 days 5 tablet Sandi Crosby, PA-C      PDMP not reviewed this encounter. An After Visit Summary was printed and given to the patient.       Sandi Crosby, [place] 01/03/24 [id number]

## 2024-02-07 IMAGING — US US OB TRANSVAGINAL
1 series · 15 of 28 positions shown · non-contrast
Comparison: None

CLINICAL DATA: Pregnant of unknown anatomic location, abdominal
pain, LMP 11/13/2021, pending quantitative beta HCG

EXAM:
TRANSVAGINAL OB ULTRASOUND
TECHNIQUE: Transvaginal ultrasound was performed for complete evaluation of the
gestation as well as the maternal uterus, adnexal regions, and
pelvic cul-de-sac.

[Series 1: us ob transvaginal · 15 of 61 slices shown]
[im 1/61]
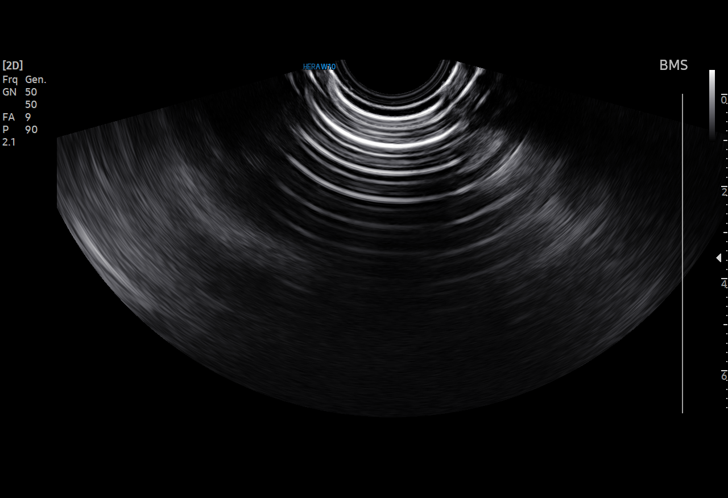
[im 5/61]
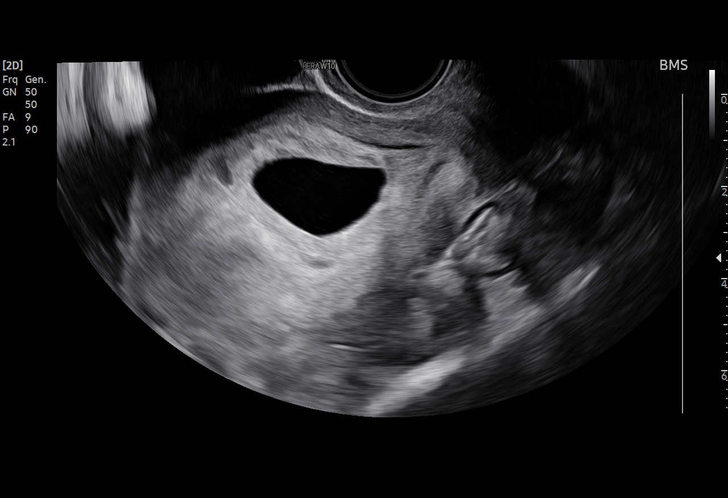
[im 9/61]
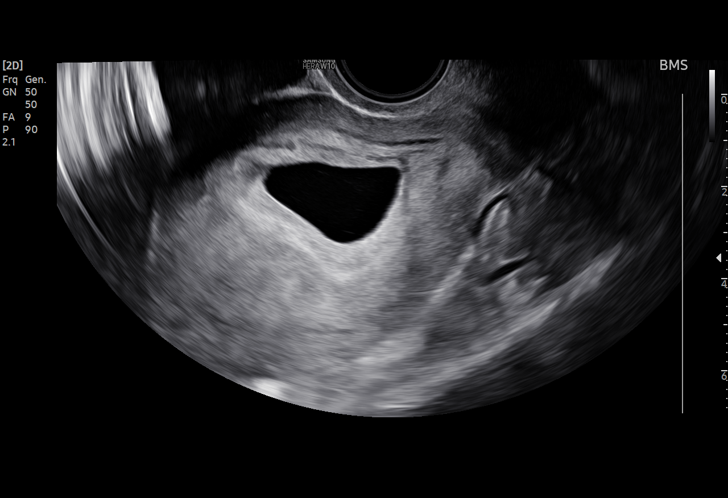
[im 14/61]
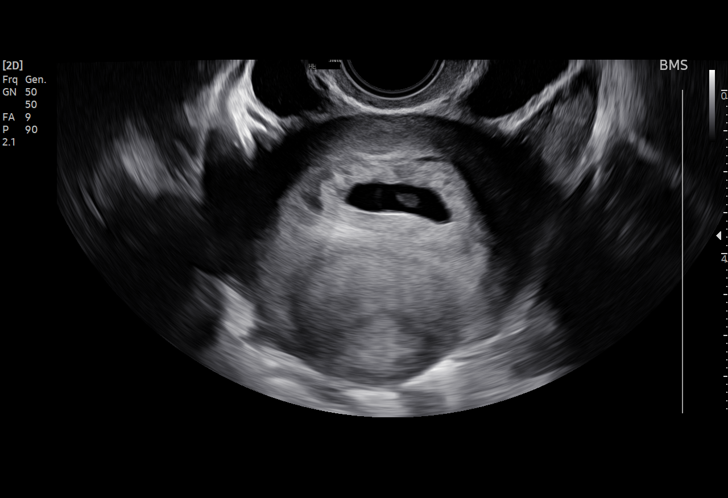
[im 18/61]
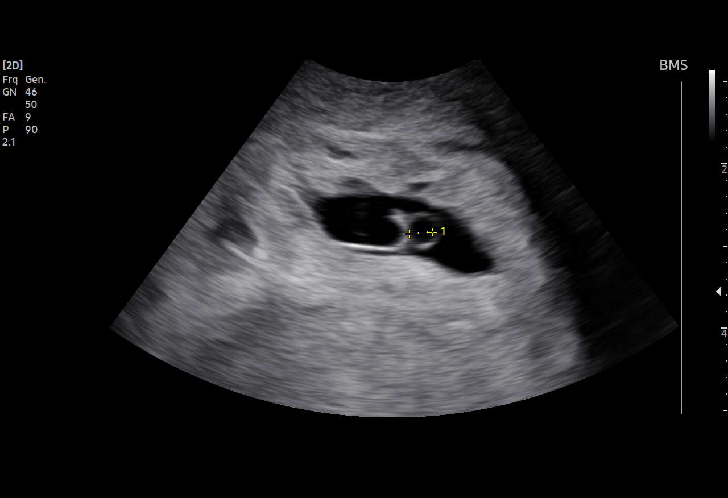
[im 23/61]
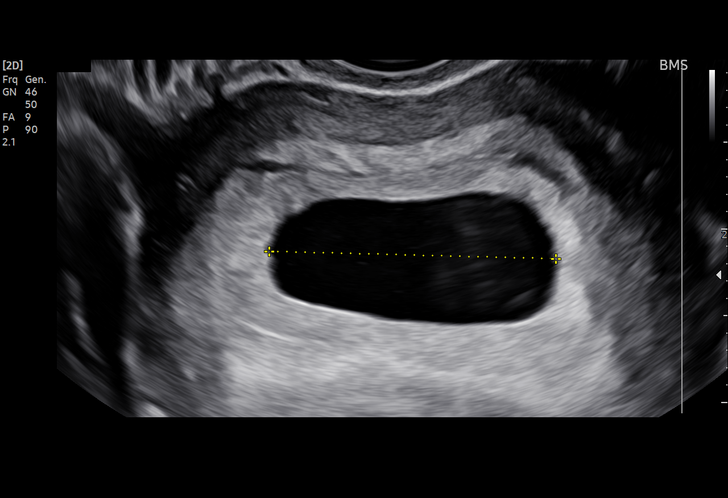
[im 27/61]
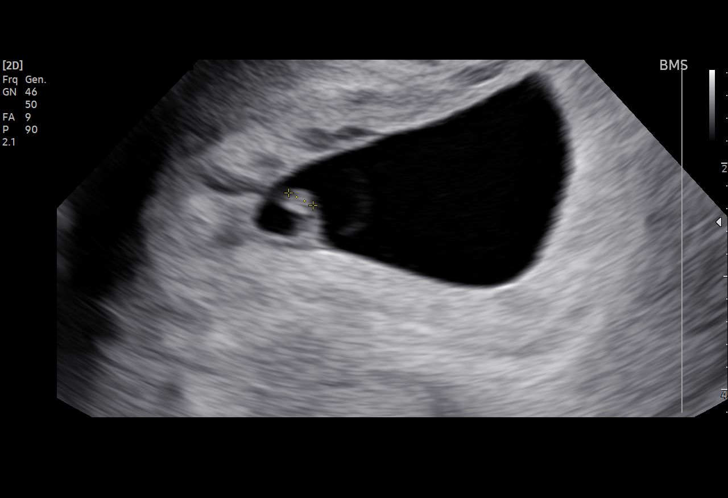
[im 32/61]
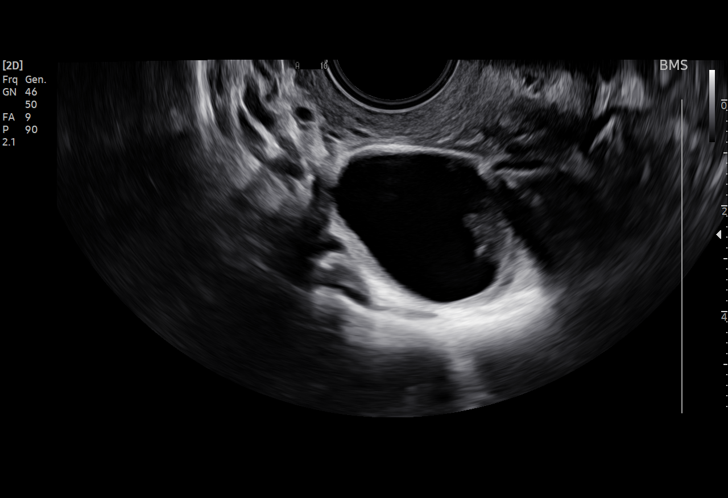
[im 34/61]
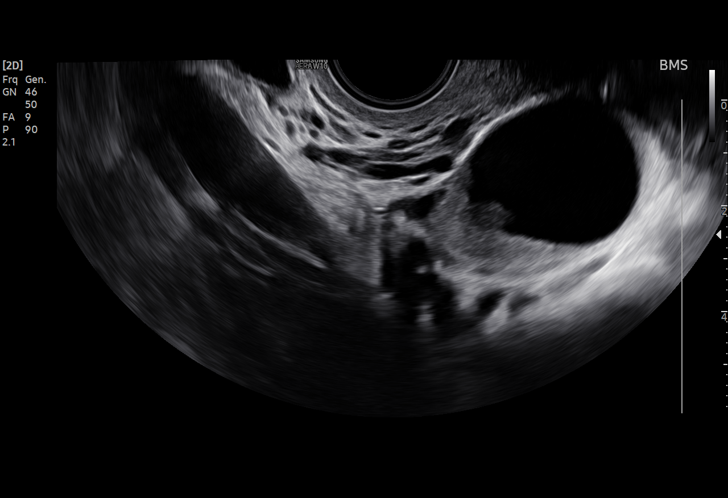
[im 38/61]
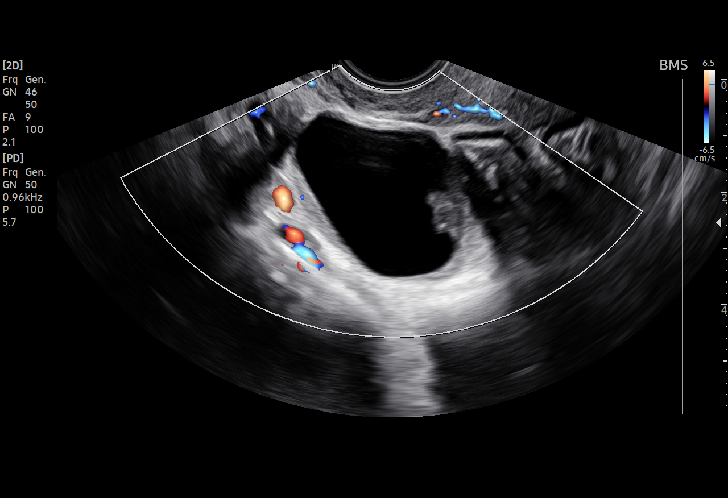
[im 43/61]
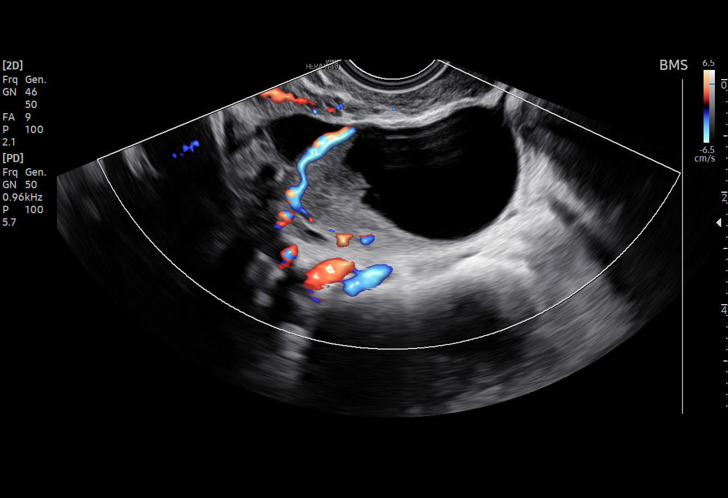
[im 47/61]
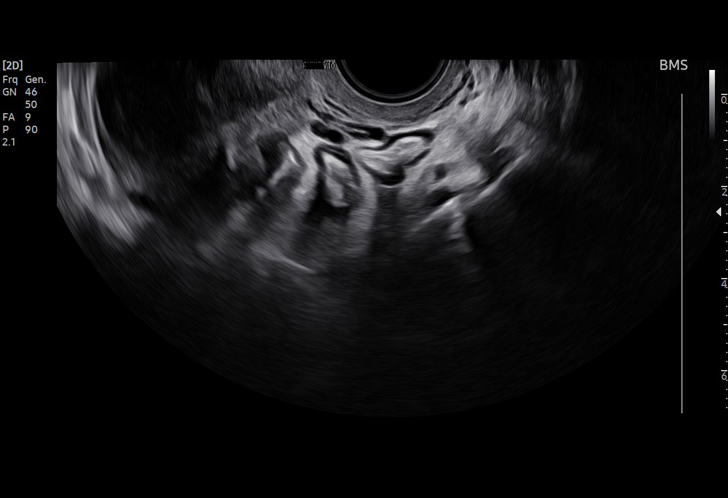
[im 52/61]
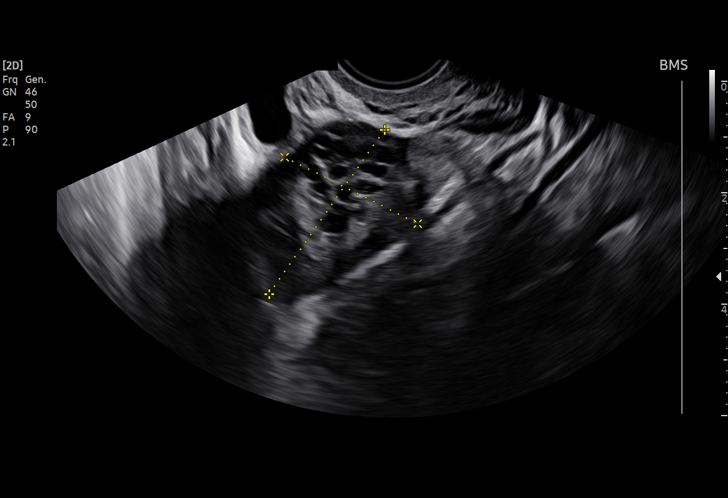
[im 56/61]
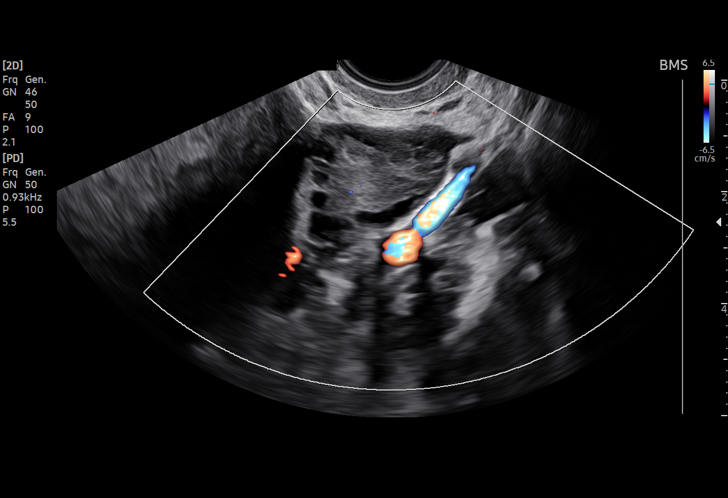
[im 61/61]
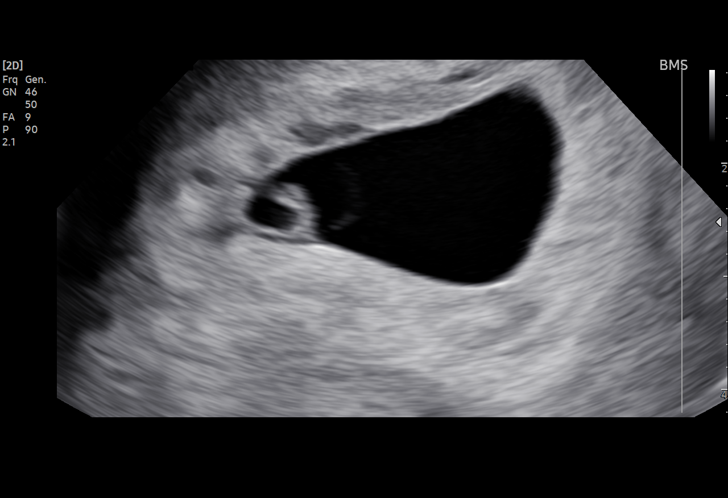

[15 of 28 positions shown; findings below may reference images not displayed]

FINDINGS: Intrauterine gestational sac: Present, single

Yolk sac:  Present

Embryo:  Present

Cardiac Activity: Present

Heart Rate: Not identified bpm

MSD:  27.7 mm, prominent size for fetal pole and yolk sac

CRL:   2.5 mm   5 w 5 d                  US EDC: 09/03/2022

Subchorionic hemorrhage:  None visualized.

Maternal uterus/adnexae:

Uterus anteverted, otherwise unremarkable.

RIGHT ovary measures 4.3 x 2.7 x 4.5 cm and contains a complicated
cyst question hemorrhagic corpus luteal cyst 3.1 cm greatest size.

Is seen

LEFT ovary normal size and morphology, 3.6 x 2.7 x 2.5 cm.

No free pelvic fluid or adnexal masses.
IMPRESSION: Intrauterine gestational sac, containing a fetal pole and yolk sac.

Gestational sac appears large for the size of the fetal pole and
yolk sac.

No fetal cardiac activity identified.

Findings are suspicious but not yet definitive for failed pregnancy.
Recommend follow-up US in 10-14 days for definitive diagnosis. This
recommendation follows SRU consensus guidelines: Diagnostic Criteria
for Nonviable Pregnancy Early in the First Trimester. N Engl J Med

## 2024-02-19 ENCOUNTER — Other Ambulatory Visit (HOSPITAL_COMMUNITY)
Admission: RE | Admit: 2024-02-19 | Discharge: 2024-02-19 | Disposition: A | Discharge status: Home / Self Care | Source: Ambulatory Visit | Attending: Family Medicine | Admitting: Family Medicine

## 2024-02-19 ENCOUNTER — Other Ambulatory Visit: Payer: Self-pay

## 2024-02-19 ENCOUNTER — Ambulatory Visit (INDEPENDENT_AMBULATORY_CARE_PROVIDER_SITE_OTHER)

## 2024-02-19 VITALS — BP 116/69 | HR 77 | Wt 155.0 lb

## 2024-02-19 DIAGNOSIS — Z202 Contact with and (suspected) exposure to infections with a predominantly sexual mode of transmission: Secondary | ICD-10-CM | POA: Diagnosis present

## 2024-02-19 MED ORDER — NITROFURANTOIN MONOHYD MACRO 100 MG PO CAPS: 100.0000 mg | ORAL_CAPSULE | Freq: Two times a day (BID) | ORAL | 0 refills | Status: AC

## 2024-02-19 NOTE — Progress Notes (Signed)
 Alexis Erickson is here with concern of vaginal itching and burning with urination. These symptoms have been present for several weeks. Patient reports she has tried nothing to resolve symptoms.  Pertinent history: Was diagnosied with UTI back in April 2025, never picked up medication for treatment  Plan of care: Dipstick urine was collected today and showed UTI, medication sent. Urine Culture sent off. Self swab was also collected.   Self swab instructions given and specimen obtained. Explained patient will be contacted with any abnormal results. Patient is 09/01/2025  Cyndee JAYSON Molt, RN 02/19/2024  11:59 AM

## 2024-02-20 LAB — CERVICOVAGINAL ANCILLARY ONLY
Bacterial Vaginitis (gardnerella): NEGATIVE
Candida Glabrata: NEGATIVE
Candida Vaginitis: NEGATIVE
Chlamydia: NEGATIVE
Comment: NEGATIVE
Comment: NEGATIVE
Comment: NEGATIVE
Comment: NEGATIVE
Comment: NEGATIVE
Comment: NORMAL
Neisseria Gonorrhea: NEGATIVE
Trichomonas: NEGATIVE

## 2024-02-20 LAB — POCT URINALYSIS DIP (DEVICE)
Bilirubin Urine: NEGATIVE
Glucose, UA: NEGATIVE mg/dL
Hgb urine dipstick: NEGATIVE
Ketones, ur: NEGATIVE mg/dL
Nitrite: NEGATIVE
Protein, ur: 30 mg/dL — AB
Specific Gravity, Urine: >=1.03 (ref 1.005–1.030)
Urobilinogen, UA: 1 mg/dL (ref 0.0–1.0)
pH: 6.5 (ref 5.0–8.0)

## 2024-02-21 LAB — URINE CULTURE

## 2024-02-28 ENCOUNTER — Encounter: Payer: Self-pay | Admitting: Family Medicine

## 2024-02-28 ENCOUNTER — Ambulatory Visit (INDEPENDENT_AMBULATORY_CARE_PROVIDER_SITE_OTHER): Admitting: Family Medicine

## 2024-02-28 VITALS — BP 127/81 | HR 88 | Wt 141.0 lb

## 2024-02-28 DIAGNOSIS — Z3042 Encounter for surveillance of injectable contraceptive: Secondary | ICD-10-CM

## 2024-02-28 DIAGNOSIS — Z124 Encounter for screening for malignant neoplasm of cervix: Secondary | ICD-10-CM

## 2024-02-28 DIAGNOSIS — Z3202 Encounter for pregnancy test, result negative: Secondary | ICD-10-CM | POA: Diagnosis not present

## 2024-02-28 DIAGNOSIS — Z3009 Encounter for other general counseling and advice on contraception: Secondary | ICD-10-CM

## 2024-02-28 LAB — POCT PREGNANCY, URINE: Preg Test, Ur: NEGATIVE

## 2024-02-28 MED ORDER — MEDROXYPROGESTERONE ACETATE 150 MG/ML IM SUSP
150.0000 mg | Freq: Once | INTRAMUSCULAR | Status: AC
  Administered 2024-02-28: 150 mg via INTRAMUSCULAR

## 2024-02-28 NOTE — Progress Notes (Signed)
 Alexis Erickson here for Depo-Provera  Injection. Injection administered without complication. Patient will return in 3 months for next injection between 05/15/24 and 05/29/24. Next annual visit due ([place] another [place]).   Cooper Pouch, RMA 02/28/2024  12:11 PM

## 2024-02-28 NOTE — Progress Notes (Signed)
   [facility] GYNECOLOGY OFFICE VISIT NOTE  History:   Alexis Erickson is a 28 y.o. [phone] here today for birth control counseling.  Currently having her period Would like to go back on depo Does not want to do her pap smear today  Health Maintenance Due  Topic Date Due   HPV VACCINES (1 - 3-dose series) Never done   Hepatitis B Vaccines (1 of 3 - 19+ 3-dose series) Never done   COVID-19 Vaccine (3 - 2024-25 season) 04/08/2023    Past Medical History:  Diagnosis Date   Anxiety    Chlamydia infection 01/09/2022   Gestational diabetes    HPV (human papilloma virus) infection    Pregnancy induced hypertension    UTI (urinary tract infection)     Past Surgical History:  Procedure Laterality Date   DILATION AND EVACUATION N/A 01/19/2022   Procedure: DILATATION AND EVACUATION;  Surgeon: Herchel Gloris LABOR, MD;  Location: [facility] OR;  Service: Gynecology;  Laterality: N/A;   TOOTH EXTRACTION      The following portions of the patient's history were reviewed and updated as appropriate: allergies, current medications, past family history, past medical history, past social history, past surgical history and problem list.   Health Maintenance:   Last pap: Lab Results  Component Value Date   DIAGPAP  09/01/2022    - Negative for intraepithelial lesion or malignancy (NILM)   HPVHIGH Positive (A) 09/01/2022    Last mammogram:  N/a    Review of Systems:  Pertinent items noted in HPI and remainder of comprehensive ROS otherwise negative.  Physical Exam:  BP 127/81 (BP Location: Left Arm, Patient Position: Sitting)   Pulse 88   Wt 141 lb (64 kg)   BMI 26.64 kg/m  CONSTITUTIONAL: Well-developed, well-nourished female in no acute distress.  HEENT:  Normocephalic, atraumatic. External right and left ear normal. No scleral icterus.  NECK: Normal range of motion, supple, no masses noted on observation SKIN: No rash noted. Not diaphoretic. No erythema. No  pallor. MUSCULOSKELETAL: Normal range of motion. No edema noted. NEUROLOGIC: Alert and oriented to person, place, and time. Normal muscle tone coordination.  PSYCHIATRIC: Normal mood and affect. Normal behavior. Normal judgment and thought content. RESPIRATORY: Effort normal, no problems with respiration noted  Labs and Imaging Results for orders placed or performed in visit on 02/28/24 (from the past week)  Pregnancy, urine POC   Collection Time: 02/28/24 11:52 AM  Result Value Ref Range   Preg Test, Ur NEGATIVE NEGATIVE   No results found.    Assessment and Plan:   Problem List Items Addressed This Visit   None Visit Diagnoses       Encounter for counseling regarding contraception    -  Primary   Relevant Medications   medroxyPROGESTERone  (DEPO-PROVERA ) injection 150 mg (Completed)     Screening for cervical cancer          Depo given today without issue  Overdue for repeat pap smear by about 6 months, but literally about to drive down to [place]  for a permament move. Emphasized importance and need to establish with care and get pap asap once she is in [place] .       Total face-to-face time with patient: 15 minutes.  Over 50% of encounter was spent on counseling and coordination of care.   Alexis CHRISTELLA Carolus, MD/MPH Attending Family Medicine Physician, [facility] for [facility], [facility]

## 2024-03-05 ENCOUNTER — Encounter: Admitting: Family Medicine
